# Patient Record
Sex: Female | Born: 1990 | Race: Black or African American | Hispanic: No | Marital: Single | Smoking: Never smoker
Health system: Southern US, Community
[De-identification: ages and names within clinical notes are randomized; demographics above are authoritative.]

## PROBLEM LIST (undated history)

## (undated) ENCOUNTER — Inpatient Hospital Stay (HOSPITAL_COMMUNITY): Payer: Self-pay

## (undated) DIAGNOSIS — I10 Essential (primary) hypertension: Secondary | ICD-10-CM

## (undated) DIAGNOSIS — A749 Chlamydial infection, unspecified: Secondary | ICD-10-CM

## (undated) HISTORY — PX: BREAST SURGERY: SHX581

## (undated) HISTORY — PX: NO PAST SURGERIES: SHX2092

---

## 2013-11-01 ENCOUNTER — Ambulatory Visit
Admission: RE | Admit: 2013-11-01 | Discharge: 2013-11-01 | Disposition: A | Discharge status: Home / Self Care | Payer: No Typology Code available for payment source | Source: Ambulatory Visit | Attending: Infectious Disease | Admitting: Infectious Disease

## 2013-11-01 ENCOUNTER — Other Ambulatory Visit: Payer: Self-pay | Admitting: Infectious Disease

## 2013-11-01 DIAGNOSIS — R7611 Nonspecific reaction to tuberculin skin test without active tuberculosis: Secondary | ICD-10-CM

## 2014-06-20 ENCOUNTER — Encounter (HOSPITAL_BASED_OUTPATIENT_CLINIC_OR_DEPARTMENT_OTHER): Payer: Self-pay

## 2014-06-20 ENCOUNTER — Emergency Department (HOSPITAL_BASED_OUTPATIENT_CLINIC_OR_DEPARTMENT_OTHER)
Admission: EM | Admit: 2014-06-20 | Discharge: 2014-06-20 | Disposition: A | Discharge status: Home / Self Care | Payer: BC Managed Care – PPO | Attending: Emergency Medicine | Admitting: Emergency Medicine

## 2014-06-20 DIAGNOSIS — Y92410 Unspecified street and highway as the place of occurrence of the external cause: Secondary | ICD-10-CM | POA: Diagnosis not present

## 2014-06-20 DIAGNOSIS — S39012A Strain of muscle, fascia and tendon of lower back, initial encounter: Secondary | ICD-10-CM

## 2014-06-20 DIAGNOSIS — Y998 Other external cause status: Secondary | ICD-10-CM | POA: Diagnosis not present

## 2014-06-20 DIAGNOSIS — S3992XA Unspecified injury of lower back, initial encounter: Secondary | ICD-10-CM | POA: Diagnosis present

## 2014-06-20 DIAGNOSIS — Y9389 Activity, other specified: Secondary | ICD-10-CM | POA: Diagnosis not present

## 2014-06-20 MED ORDER — IBUPROFEN 800 MG PO TABS: 800.0000 mg | ORAL_TABLET | Freq: Three times a day (TID) | ORAL | Status: DC

## 2014-06-20 MED ORDER — CYCLOBENZAPRINE HCL 10 MG PO TABS: 10.0000 mg | ORAL_TABLET | Freq: Three times a day (TID) | ORAL | Status: DC | PRN
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
## 2014-06-26 ENCOUNTER — Encounter (HOSPITAL_BASED_OUTPATIENT_CLINIC_OR_DEPARTMENT_OTHER): Payer: Self-pay

## 2014-06-26 ENCOUNTER — Emergency Department (HOSPITAL_BASED_OUTPATIENT_CLINIC_OR_DEPARTMENT_OTHER)
Admission: EM | Admit: 2014-06-26 | Discharge: 2014-06-26 | Disposition: A | Discharge status: Home / Self Care | Payer: BC Managed Care – PPO | Attending: Emergency Medicine | Admitting: Emergency Medicine

## 2014-06-26 DIAGNOSIS — M545 Low back pain, unspecified: Secondary | ICD-10-CM

## 2014-06-26 DIAGNOSIS — S3992XD Unspecified injury of lower back, subsequent encounter: Secondary | ICD-10-CM | POA: Insufficient documentation

## 2014-06-26 MED ORDER — TRAMADOL HCL 50 MG PO TABS: 50.0000 mg | ORAL_TABLET | Freq: Four times a day (QID) | ORAL | Status: DC | PRN
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
## 2014-06-26 NOTE — Discharge Instructions (Signed)
Back Pain, Adult °Low back pain is very common. About 1 in 5 people have back pain. The cause of low back pain is rarely dangerous. The pain often gets better over time. About half of people with a sudden onset of back pain feel better in just 2 weeks. About 8 in 10 people feel better by 6 weeks.  °CAUSES °Some common causes of back pain include: °· Strain of the muscles or ligaments supporting the spine. °· Wear and tear (degeneration) of the spinal discs. °· Arthritis. °· Direct injury to the back. °DIAGNOSIS °Most of the time, the direct cause of low back pain is not known. However, back pain can be treated effectively even when the exact cause of the pain is unknown. Answering your caregiver's questions about your overall health and symptoms is one of the most accurate ways to make sure the cause of your pain is not dangerous. If your caregiver needs more information, he or she may order lab work or imaging tests (X-rays or MRIs). However, even if imaging tests show changes in your back, this usually does not require surgery. °HOME CARE INSTRUCTIONS °For many people, back pain returns. Since low back pain is rarely dangerous, it is often a condition that people can learn to manage on their own.  °· Remain active. It is stressful on the back to sit or stand in one place. Do not sit, drive, or stand in one place for more than 30 minutes at a time. Take short walks on level surfaces as soon as pain allows. Try to increase the length of time you walk each day. °· Do not stay in bed. Resting more than 1 or 2 days can delay your recovery. °· Do not avoid exercise or work. Your body is made to move. It is not dangerous to be active, even though your back may hurt. Your back will likely heal faster if you return to being active before your pain is gone. °· Pay attention to your body when you  bend and lift. Many people have less discomfort when lifting if they bend their knees, keep the load close to their bodies, and  avoid twisting. Often, the most comfortable positions are those that put less stress on your recovering back. °· Find a comfortable position to sleep. Use a firm mattress and lie on your side with your knees slightly bent. If you lie on your back, put a pillow under your knees. °· Only take over-the-counter or prescription medicines as directed by your caregiver. Over-the-counter medicines to reduce pain and inflammation are often the most helpful. Your caregiver may prescribe muscle relaxant drugs. These medicines help dull your pain so you can more quickly return to your normal activities and healthy exercise. °· Put ice on the injured area. °¨ Put ice in a plastic bag. °¨ Place a towel between your skin and the bag. °¨ Leave the ice on for 15-20 minutes, 03-04 times a day for the first 2 to 3 days. After that, ice and heat may be alternated to reduce pain and spasms. °· Ask your caregiver about trying back exercises and gentle massage. This may be of some benefit. °· Avoid feeling anxious or stressed. Stress increases muscle tension and can worsen back pain. It is important to recognize when you are anxious or stressed and learn ways to manage it. Exercise is a great option. °SEEK MEDICAL CARE IF: °· You have pain that is not relieved with rest or medicine. °· You have pain that does not improve in 1 week. °· You have new symptoms. °· You are generally not feeling well. °SEEK   IMMEDIATE MEDICAL CARE IF:  °· You have pain that radiates from your back into your legs. °· You develop new bowel or bladder control problems. °· You have unusual weakness or numbness in your arms or legs. °· You develop nausea or vomiting. °· You develop abdominal pain. °· You feel faint. °Document Released: 06/16/2005 Document Revised: 12/16/2011 Document Reviewed: 10/18/2013 °ExitCare® Patient Information ©2015 [employer]. This information is not intended to replace advice given to you by your health care provider. Make sure you  discuss any questions you have with your health care provider. ° °Back Exercises °Back exercises help treat and prevent back injuries. The goal of back exercises is to increase the strength of your abdominal and back muscles and the flexibility of your back. These exercises should be started when you no longer have back pain. Back exercises include: °· Pelvic Tilt. Lie on your back with your knees bent. Tilt your pelvis until the lower part of your back is against the floor. Hold this position 5 to 10 sec and repeat 5 to 10 times. °· Knee to Chest. Pull first 1 knee up against your chest and hold for 20 to 30 seconds, repeat this with the other knee, and then both knees. This may be done with the other leg straight or bent, whichever feels better. °· Sit-Ups or Curl-Ups. Bend your knees 90 degrees. Start with tilting your pelvis, and do a partial, slow sit-up, lifting your trunk only 30 to 45 degrees off the floor. Take at least 2 to 3 seconds for each sit-up. Do not do sit-ups with your knees out straight. If partial sit-ups are difficult, simply do the above but with only tightening your abdominal muscles and holding it as directed. °· Hip-Lift. Lie on your back with your knees flexed 90 degrees. Push down with your feet and shoulders as you raise your hips a couple inches off the floor; hold for 10 seconds, repeat 5 to 10 times. °· Back arches. Lie on your stomach, propping yourself up on bent elbows. Slowly press on your hands, causing an arch in your low back. Repeat 3 to 5 times. Any initial stiffness and discomfort should lessen with repetition over time. °· Shoulder-Lifts. Lie face down with arms beside your body. Keep hips and torso pressed to floor as you slowly lift your head and shoulders off the floor. °Do not overdo your exercises, especially in the beginning. Exercises may cause you some mild back discomfort which lasts for a few minutes; however, if the pain is more severe, or lasts for more than 15  minutes, do not continue exercises until you see your caregiver. Improvement with exercise therapy for back problems is slow.  °See your caregivers for assistance with developing a proper back exercise program. °Document Released: 07/24/2004 Document Revised: 09/08/2011 Document Reviewed: 04/17/2011 °ExitCare® Patient Information ©2015 [employer]. This information is not intended to replace advice given to you by your health care provider. Make sure you discuss any questions you have with your health care provider. ° °
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
## 2015-11-21 LAB — OB RESULTS CONSOLE HEPATITIS B SURFACE ANTIGEN: HEP B S AG: NEGATIVE

## 2015-11-21 LAB — OB RESULTS CONSOLE ABO/RH: RH TYPE: POSITIVE

## 2015-11-21 LAB — OB RESULTS CONSOLE GC/CHLAMYDIA
Chlamydia: NEGATIVE
Gonorrhea: NEGATIVE

## 2015-11-21 LAB — OB RESULTS CONSOLE HIV ANTIBODY (ROUTINE TESTING): HIV: NONREACTIVE

## 2015-11-21 LAB — OB RESULTS CONSOLE GBS: GBS: NEGATIVE

## 2015-11-21 LAB — OB RESULTS CONSOLE ANTIBODY SCREEN: Antibody Screen: NEGATIVE

## 2015-11-21 LAB — OB RESULTS CONSOLE RPR: RPR: NONREACTIVE

## 2015-11-21 LAB — OB RESULTS CONSOLE RUBELLA ANTIBODY, IGM: RUBELLA: IMMUNE

## 2015-12-13 ENCOUNTER — Other Ambulatory Visit: Payer: Self-pay | Admitting: Obstetrics and Gynecology

## 2015-12-13 DIAGNOSIS — N6310 Unspecified lump in the right breast, unspecified quadrant: Principal | ICD-10-CM

## 2015-12-13 DIAGNOSIS — N6315 Unspecified lump in the right breast, overlapping quadrants: Secondary | ICD-10-CM

## 2015-12-18 ENCOUNTER — Ambulatory Visit
Admission: RE | Admit: 2015-12-18 | Discharge: 2015-12-18 | Disposition: A | Discharge status: Home / Self Care | Payer: BLUE CROSS/BLUE SHIELD | Source: Ambulatory Visit | Attending: Obstetrics and Gynecology | Admitting: Obstetrics and Gynecology

## 2015-12-18 DIAGNOSIS — N6310 Unspecified lump in the right breast, unspecified quadrant: Principal | ICD-10-CM

## 2015-12-18 DIAGNOSIS — N6315 Unspecified lump in the right breast, overlapping quadrants: Secondary | ICD-10-CM

## 2016-04-18 ENCOUNTER — Inpatient Hospital Stay (HOSPITAL_COMMUNITY)
Admission: AD | Admit: 2016-04-18 | Discharge: 2016-04-18 | Disposition: A | Discharge status: Home / Self Care | Payer: BLUE CROSS/BLUE SHIELD | Source: Ambulatory Visit | Attending: Obstetrics and Gynecology | Admitting: Obstetrics and Gynecology

## 2016-04-18 ENCOUNTER — Encounter (HOSPITAL_COMMUNITY): Payer: Self-pay | Admitting: *Deleted

## 2016-04-18 DIAGNOSIS — O9A213 Injury, poisoning and certain other consequences of external causes complicating pregnancy, third trimester: Secondary | ICD-10-CM | POA: Diagnosis not present

## 2016-04-18 DIAGNOSIS — T1490XA Injury, unspecified, initial encounter: Secondary | ICD-10-CM

## 2016-04-18 DIAGNOSIS — Z3A33 33 weeks gestation of pregnancy: Secondary | ICD-10-CM | POA: Diagnosis not present

## 2016-04-18 DIAGNOSIS — Z3689 Encounter for other specified antenatal screening: Secondary | ICD-10-CM

## 2016-04-18 DIAGNOSIS — Z3493 Encounter for supervision of normal pregnancy, unspecified, third trimester: Secondary | ICD-10-CM

## 2016-04-18 DIAGNOSIS — O26893 Other specified pregnancy related conditions, third trimester: Secondary | ICD-10-CM | POA: Insufficient documentation

## 2016-04-18 HISTORY — DX: Chlamydial infection, unspecified: A74.9

## 2016-04-18 MED ORDER — ACETAMINOPHEN 500 MG PO TABS
1000.0000 mg | ORAL_TABLET | Freq: Four times a day (QID) | ORAL | Status: DC | PRN
  Administered 2016-04-18: 1000 mg via ORAL
  Filled 2016-04-18: qty 2

## 2016-04-18 NOTE — MAU Note (Signed)
Urine in lab 
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
## 2016-04-18 NOTE — Discharge Instructions (Signed)
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
## 2016-04-27 ENCOUNTER — Inpatient Hospital Stay (HOSPITAL_COMMUNITY)
Admission: AD | Admit: 2016-04-27 | Discharge: 2016-04-27 | Disposition: A | Discharge status: Home / Self Care | Payer: BLUE CROSS/BLUE SHIELD | Source: Ambulatory Visit | Attending: Obstetrics and Gynecology | Admitting: Obstetrics and Gynecology

## 2016-04-27 ENCOUNTER — Encounter (HOSPITAL_COMMUNITY): Payer: Self-pay | Admitting: Certified Nurse Midwife

## 2016-04-27 DIAGNOSIS — O26893 Other specified pregnancy related conditions, third trimester: Secondary | ICD-10-CM | POA: Diagnosis not present

## 2016-04-27 DIAGNOSIS — O169 Unspecified maternal hypertension, unspecified trimester: Secondary | ICD-10-CM

## 2016-04-27 DIAGNOSIS — O26899 Other specified pregnancy related conditions, unspecified trimester: Secondary | ICD-10-CM

## 2016-04-27 DIAGNOSIS — N898 Other specified noninflammatory disorders of vagina: Secondary | ICD-10-CM | POA: Diagnosis present

## 2016-04-27 DIAGNOSIS — Z3A35 35 weeks gestation of pregnancy: Secondary | ICD-10-CM | POA: Insufficient documentation

## 2016-04-27 DIAGNOSIS — R030 Elevated blood-pressure reading, without diagnosis of hypertension: Secondary | ICD-10-CM | POA: Diagnosis not present

## 2016-04-27 LAB — COMPREHENSIVE METABOLIC PANEL
ALT: 14 U/L (ref 14–54)
ANION GAP: 6 (ref 5–15)
AST: 17 U/L (ref 15–41)
Albumin: 3 g/dL — ABNORMAL LOW (ref 3.5–5.0)
Alkaline Phosphatase: 57 U/L (ref 38–126)
BUN: 8 mg/dL (ref 6–20)
CHLORIDE: 105 mmol/L (ref 101–111)
CO2: 25 mmol/L (ref 22–32)
Calcium: 9 mg/dL (ref 8.9–10.3)
Creatinine, Ser: 0.73 mg/dL (ref 0.44–1.00)
Glucose, Bld: 83 mg/dL (ref 65–99)
POTASSIUM: 4.3 mmol/L (ref 3.5–5.1)
Sodium: 136 mmol/L (ref 135–145)
Total Bilirubin: 0.4 mg/dL (ref 0.3–1.2)
Total Protein: 6.7 g/dL (ref 6.5–8.1)

## 2016-04-27 LAB — PROTEIN / CREATININE RATIO, URINE
CREATININE, URINE: 82 mg/dL
Protein Creatinine Ratio: 0.09 mg/mg{Cre} (ref 0.00–0.15)
TOTAL PROTEIN, URINE: 7 mg/dL

## 2016-04-27 LAB — CBC
HCT: 32.8 % — ABNORMAL LOW (ref 36.0–46.0)
Hemoglobin: 10.8 g/dL — ABNORMAL LOW (ref 12.0–15.0)
MCH: 24.3 pg — AB (ref 26.0–34.0)
MCHC: 32.9 g/dL (ref 30.0–36.0)
MCV: 73.7 fL — AB (ref 78.0–100.0)
PLATELETS: 301 10*3/uL (ref 150–400)
RBC: 4.45 MIL/uL (ref 3.87–5.11)
RDW: 18.4 % — AB (ref 11.5–15.5)
WBC: 9.5 10*3/uL (ref 4.0–10.5)

## 2016-04-27 NOTE — Discharge Instructions (Signed)

## 2016-04-27 NOTE — MAU Note (Signed)
Pt states she is having ctxs every 4 minutes for the last 40 minutes. Pt states she has been leaking all day today. Pt denies vaginal bleeding. Fetus is active.

## 2016-04-27 NOTE — MAU Provider Note (Signed)
Chief Complaint:  Rupture of Membranes and Labor Eval   First Provider Initiated Contact with Patient 04/27/16 2045     HPI: Michele Pennington is a 25 y.o. G2P0010 at 3135w0dwho presents to maternity admissions reporting leaking of fluid making her underwear wet. Did not require wearing pads.  States has some tightening up of abdomen.  Not very painful, just "crampy". She reports good fetal movement, denies vaginal bleeding, vaginal itching/burning, urinary symptoms, h/a, dizziness, n/v, diarrhea, constipation or fever/chills.  She denies headache, visual changes or RUQ abdominal pain. But did have a headache this morning.  Vaginal Discharge  The patient's primary symptoms include vaginal discharge. The patient's pertinent negatives include no genital itching, genital lesions, genital odor or vaginal bleeding. This is a new problem. The current episode started today. The problem occurs intermittently. The problem has been unchanged. The pain is mild. She is pregnant. Associated symptoms include abdominal pain. Pertinent negatives include no back pain, chills, constipation, diarrhea, fever, hematuria, nausea or vomiting. The vaginal discharge was white and milky. There has been no bleeding. She has not been passing clots. She has not been passing tissue. Nothing aggravates the symptoms. She has tried nothing for the symptoms.   RN Note: Pt states she is having ctxs every 4 minutes for the last 40 minutes. Pt states she has been leaking all day today. Pt denies vaginal bleeding. Fetus is active.  Past Medical History: Past Medical History:  Diagnosis Date  . Chlamydia   . Medical history non-contributory     Past obstetric history: OB History  Gravida Para Term Preterm AB Living  2       1 0  SAB TAB Ectopic Multiple Live Births    1     0    # Outcome Date GA Lbr Len/2nd Weight Sex Delivery Anes PTL Lv  2 Current           1 TAB               Past Surgical History: Past Surgical  History:  Procedure Laterality Date  . NO PAST SURGERIES      Family History: Family History  Problem Relation Age of Onset  . Diabetes Mother   . Hypertension Mother   . Cancer Mother   . Diabetes Maternal Aunt   . Diabetes Maternal Grandmother     Social History: Social History  Substance Use Topics  . Smoking status: Never Smoker  . Smokeless tobacco: Never Used  . Alcohol use No    Allergies: No Known Allergies  Meds:  Prescriptions Prior to Admission  Medication Sig Dispense Refill Last Dose  . acetaminophen (TYLENOL) 500 MG tablet Take 1,000 mg by mouth every 8 (eight) hours as needed for mild pain or headache.   04/17/2016 at Unknown time  . cyclobenzaprine (FLEXERIL) 10 MG tablet Take 1 tablet (10 mg total) by mouth every 8 (eight) hours as needed for muscle spasms. 10 tablet 0   . Fe Fum-FePoly-Vit C-Vit B3 (INTEGRA) 62.5-62.5-40-3 MG CAPS Take 1 capsule by mouth daily.   04/17/2016 at Unknown time  . Prenatal Vit-Fe Fumarate-FA (PRENATAL MULTIVITAMIN) TABS tablet Take 1 tablet by mouth daily at 12 noon.   04/18/2016 at Unknown time    I have reviewed patient's Past Medical Hx, Surgical Hx, Family Hx, Social Hx, medications and allergies.   ROS:  Review of Systems  Constitutional: Negative for chills and fever.  Gastrointestinal: Positive for abdominal pain. Negative for constipation, diarrhea, nausea  and vomiting.  Genitourinary: Positive for vaginal discharge. Negative for hematuria.  Musculoskeletal: Negative for back pain.   Other systems negative  Physical Exam  Patient Vitals for the past 24 hrs:  BP Temp Temp src Pulse Resp  04/27/16 2043 141/81 97.6 F (36.4 C) Oral 102 18   Vitals:   04/27/16 2043 04/27/16 2050 04/27/16 2105  BP: 141/81 144/81 141/78  Pulse: 102 96 104  Resp: 18    Temp: 97.6 F (36.4 C)    TempSrc: Oral      Constitutional: Well-developed, well-nourished female in no acute distress.  Cardiovascular: normal rate and  rhythm Respiratory: normal effort, clear to auscultation bilaterally GI: Abd soft, non-tender, gravid appropriate for gestational age.   No rebound or guarding. MS: Extremities nontender, no edema, normal ROM Neurologic: Alert and oriented x 4.   DTRs 2+ with no clonus GU: Neg CVAT.  PELVIC EXAM: Cervix pink, visually closed, without lesion, mod to large milky white discharge, vaginal walls and external genitalia normal No pooling, no ferning  Dilation: Closed Effacement (%): Thick Station: -3 Exam by:: Artelia LarocheM. Hollie Bartus CNM  FHT:  Baseline 140 , moderate variability, accelerations present, no decelerations  Several tiny dips in HR, lasting only 10 seconds each, will watch Contractions:  Irregular, appears to have little discomfort when she has them.  About 3 UCs in 30-40 minutes   Labs:   Imaging:  No results found.  MAU Course/MDM: I have ordered labs and reviewed results.  PIH labs ordered due to elevated systolic pressures NST reviewed Consult Dr [name] with presentation, exam findings and test results.    Assessment: SIUP at 565w0d Leukorrhea No evidence of ruptured membranes Systolic blood pressure elevation, per Dr [name], patient may have chronic hypertension  Plan: PIH evaluation.   Wynelle BourgeoisMarie Edison Nicholson CNM, MSN Certified Nurse-Midwife 04/27/2016 8:55 PM

## 2016-05-20 ENCOUNTER — Inpatient Hospital Stay (HOSPITAL_COMMUNITY)
Admission: AD | Admit: 2016-05-20 | Discharge: 2016-05-20 | Disposition: A | Discharge status: Home / Self Care | Payer: BLUE CROSS/BLUE SHIELD | Source: Ambulatory Visit | Attending: Obstetrics & Gynecology | Admitting: Obstetrics & Gynecology

## 2016-05-20 ENCOUNTER — Encounter (HOSPITAL_COMMUNITY): Payer: Self-pay | Admitting: *Deleted

## 2016-05-20 ENCOUNTER — Telehealth (HOSPITAL_COMMUNITY): Payer: Self-pay | Admitting: *Deleted

## 2016-05-20 DIAGNOSIS — O133 Gestational [pregnancy-induced] hypertension without significant proteinuria, third trimester: Secondary | ICD-10-CM | POA: Insufficient documentation

## 2016-05-20 DIAGNOSIS — Z3A38 38 weeks gestation of pregnancy: Secondary | ICD-10-CM | POA: Diagnosis not present

## 2016-05-20 DIAGNOSIS — R030 Elevated blood-pressure reading, without diagnosis of hypertension: Secondary | ICD-10-CM | POA: Diagnosis present

## 2016-05-20 HISTORY — DX: Essential (primary) hypertension: I10

## 2016-05-20 LAB — COMPREHENSIVE METABOLIC PANEL
ALK PHOS: 76 U/L (ref 38–126)
ALT: 12 U/L — AB (ref 14–54)
AST: 17 U/L (ref 15–41)
Albumin: 2.8 g/dL — ABNORMAL LOW (ref 3.5–5.0)
Anion gap: 8 (ref 5–15)
BUN: 8 mg/dL (ref 6–20)
CALCIUM: 8.9 mg/dL (ref 8.9–10.3)
CO2: 22 mmol/L (ref 22–32)
CREATININE: 0.77 mg/dL (ref 0.44–1.00)
Chloride: 105 mmol/L (ref 101–111)
GFR calc non Af Amer: >60 mL/min (ref >60)
Glucose, Bld: 100 mg/dL — ABNORMAL HIGH (ref 65–99)
Potassium: 4 mmol/L (ref 3.5–5.1)
SODIUM: 135 mmol/L (ref 135–145)
Total Bilirubin: 0.1 mg/dL — ABNORMAL LOW (ref 0.3–1.2)
Total Protein: 6.6 g/dL (ref 6.5–8.1)

## 2016-05-20 LAB — CBC
HCT: 32.7 % — ABNORMAL LOW (ref 36.0–46.0)
HEMOGLOBIN: 10.7 g/dL — AB (ref 12.0–15.0)
MCH: 23.9 pg — AB (ref 26.0–34.0)
MCHC: 32.7 g/dL (ref 30.0–36.0)
MCV: 73.2 fL — ABNORMAL LOW (ref 78.0–100.0)
Platelets: 371 10*3/uL (ref 150–400)
RBC: 4.47 MIL/uL (ref 3.87–5.11)
RDW: 19.1 % — ABNORMAL HIGH (ref 11.5–15.5)
WBC: 8.9 10*3/uL (ref 4.0–10.5)

## 2016-05-20 LAB — URINALYSIS, ROUTINE W REFLEX MICROSCOPIC
Bilirubin Urine: NEGATIVE
Glucose, UA: NEGATIVE mg/dL
Hgb urine dipstick: NEGATIVE
KETONES UR: NEGATIVE mg/dL
LEUKOCYTES UA: NEGATIVE
NITRITE: NEGATIVE
PH: 6 (ref 5.0–8.0)
Protein, ur: NEGATIVE mg/dL

## 2016-05-20 LAB — LACTATE DEHYDROGENASE: LDH: 151 U/L (ref 98–192)

## 2016-05-20 LAB — PROTEIN / CREATININE RATIO, URINE
Creatinine, Urine: 84 mg/dL
Protein Creatinine Ratio: 0.08 mg/mg{Cre} (ref 0.00–0.15)
TOTAL PROTEIN, URINE: 7 mg/dL

## 2016-05-20 LAB — URIC ACID: URIC ACID, SERUM: 6.7 mg/dL — AB (ref 2.3–6.6)

## 2016-05-20 NOTE — MAU Note (Addendum)
Elevated BP in office. Recent hx seeing spots, HA. No visual sxs today, very slight HA. Trace protein. Sent from office. Hx chronic HTN. No medications.

## 2016-05-20 NOTE — Telephone Encounter (Signed)
Preadmission screen  

## 2016-05-20 NOTE — MAU Provider Note (Signed)
History     CSN: [id number]  Arrival date and time: 05/20/16 0930   First Provider Initiated Contact with Patient 05/20/16 1009      Chief Complaint  Patient presents with  . Hypertension   HPI Michele Pennington is a 25 y.o. G2P0010 at 1132w2d who presents sent from office for BP evaluation. [facility]; not currently on meds. Was in office today for routine OB appt & found to have elevated BP. Reports dull frontal headache today that she rates 2/10. Has not treated. Has blurred vision yesterday; no vision changes today. Denies chest pain, epigastric pain, n/v, abdominal pain, VB, or LOF. Positive fetal movement. Saw Dr. Charlotta Newtonzan today but normally sees Dr. [name].   OB History    Gravida Para Term Preterm AB Living   2       1 0   SAB TAB Ectopic Multiple Live Births     1     0      Past Medical History:  Diagnosis Date  . Chlamydia   . Hypertension     Past Surgical History:  Procedure Laterality Date  . NO PAST SURGERIES      Family History  Problem Relation Age of Onset  . Diabetes Mother   . Hypertension Mother   . Cancer Mother   . Diabetes Maternal Aunt   . Diabetes Maternal Grandmother     Social History  Substance Use Topics  . Smoking status: Never Smoker  . Smokeless tobacco: Never Used  . Alcohol use No    Allergies: No Known Allergies  Prescriptions Prior to Admission  Medication Sig Dispense Refill Last Dose  . acetaminophen (TYLENOL) 500 MG tablet Take 1,000 mg by mouth every 8 (eight) hours as needed for mild pain or headache.   Past Week at Unknown time  . Fe Fum-FePoly-Vit C-Vit B3 (INTEGRA) 62.5-62.5-40-3 MG CAPS Take 1 capsule by mouth daily.   05/19/2016 at Unknown time  . Prenatal Vit-Fe Fumarate-FA (PRENATAL MULTIVITAMIN) TABS tablet Take 1 tablet by mouth daily at 12 noon.   05/20/2016 at Unknown time    Review of Systems  Constitutional: Negative.   Eyes: Negative for blurred vision.  Respiratory: Negative for  shortness of breath.   Cardiovascular: Negative for chest pain.  Gastrointestinal: Negative.   Genitourinary: Negative.   Neurological: Positive for headaches. Negative for dizziness.   Physical Exam   Blood pressure 156/83, pulse 75, temperature 99.1 F (37.3 C), temperature source Oral, resp. rate 18, height 5\' 3"  (1.6 m), weight 235 lb (106.6 kg).  Temp:  [99.1 F (37.3 C)] 99.1 F (37.3 C) (11/21 0947) Pulse Rate:  [61-89] 61 (11/21 1146) Resp:  [16-18] 16 (11/21 1146) BP: (136-156)/(81-95) 150/90 (11/21 1146) Weight:  [235 lb (106.6 kg)] 235 lb (106.6 kg) (11/21 [id number])   Physical Exam  Nursing note and vitals reviewed. Constitutional: She is oriented to person, place, and time. She appears well-developed and well-nourished. No distress.  HENT:  Head: Normocephalic and atraumatic.  Eyes: Conjunctivae are normal. Right eye exhibits no discharge. Left eye exhibits no discharge. No scleral icterus.  Neck: Normal range of motion.  Cardiovascular: Normal rate, regular rhythm and normal heart sounds.   No murmur heard. Respiratory: Effort normal and breath sounds normal. No respiratory distress. She has no wheezes.  GI: Soft. There is no tenderness.  Musculoskeletal: She exhibits edema (2+ pitting edema BLE).  Neurological: She is alert and oriented to person, place, and time. She has normal  reflexes.  No clonus  Skin: Skin is warm and dry. She is not diaphoretic.  Psychiatric: She has a normal mood and affect. Her behavior is normal. Judgment and thought content normal.   Fetal Tracing:  Baseline: 140 Variability: moderate Accelerations: 15x15 Decelerations: none  Toco: irregular ctx, 4-8 mins; pt denies  MAU Course  Procedures Results for orders placed or performed during the hospital encounter of 05/20/16 (from the past 24 hour(s))  Urinalysis, Routine w reflex microscopic ([facility])     Status: Abnormal   Collection Time: 05/20/16  9:57 AM  Result Value Ref  Range   Color, Urine YELLOW YELLOW   APPearance CLEAR CLEAR   Specific Gravity, Urine <1.005 (L) 1.005 - 1.030   pH 6.0 5.0 - 8.0   Glucose, UA NEGATIVE NEGATIVE mg/dL   Hgb urine dipstick NEGATIVE NEGATIVE   Bilirubin Urine NEGATIVE NEGATIVE   Ketones, ur NEGATIVE NEGATIVE mg/dL   Protein, ur NEGATIVE NEGATIVE mg/dL   Nitrite NEGATIVE NEGATIVE   Leukocytes, UA NEGATIVE NEGATIVE  Protein / creatinine ratio, urine     Status: None   Collection Time: 05/20/16 10:02 AM  Result Value Ref Range   Creatinine, Urine 84.00 mg/dL   Total Protein, Urine 7 mg/dL   Protein Creatinine Ratio 0.08 0.00 - 0.15 mg/mg[Cre]  CBC     Status: Abnormal   Collection Time: 05/20/16 10:21 AM  Result Value Ref Range   WBC 8.9 4.0 - 10.5 K/uL   RBC 4.47 3.87 - 5.11 MIL/uL   Hemoglobin 10.7 (L) 12.0 - 15.0 g/dL   HCT 78.232.7 (L) 95.636.0 - 21.346.0 %   MCV 73.2 (L) 78.0 - 100.0 fL   MCH 23.9 (L) 26.0 - 34.0 pg   MCHC 32.7 30.0 - 36.0 g/dL   RDW 08.619.1 (H) 57.811.5 - 46.915.5 %   Platelets 371 150 - 400 K/uL  Comprehensive metabolic panel     Status: Abnormal   Collection Time: 05/20/16 10:21 AM  Result Value Ref Range   Sodium 135 135 - 145 mmol/L   Potassium 4.0 3.5 - 5.1 mmol/L   Chloride 105 101 - 111 mmol/L   CO2 22 22 - 32 mmol/L   Glucose, Bld 100 (H) 65 - 99 mg/dL   BUN 8 6 - 20 mg/dL   Creatinine, Ser 6.290.77 0.44 - 1.00 mg/dL   Calcium 8.9 8.9 - 52.810.3 mg/dL   Total Protein 6.6 6.5 - 8.1 g/dL   Albumin 2.8 (L) 3.5 - 5.0 g/dL   AST 17 15 - 41 U/L   ALT 12 (L) 14 - 54 U/L   Alkaline Phosphatase 76 38 - 126 U/L   Total Bilirubin 0.1 (L) 0.3 - 1.2 mg/dL   GFR calc non Af Amer >60 >60 mL/min   GFR calc Af Amer >60 >60 mL/min   Anion gap 8 5 - 15  Lactate dehydrogenase     Status: None   Collection Time: 05/20/16 10:21 AM  Result Value Ref Range   LDH 151 98 - 192 U/L  Uric acid     Status: Abnormal   Collection Time: 05/20/16 10:21 AM  Result Value Ref Range   Uric Acid, Serum 6.7 (H) 2.3 - 6.6 mg/dL     MDM Reactive fetal tracing Elevated BPs; none severe range CBC, CMP, uric acid, LDR, urine PCR S/w Dr. Charlotta Newtonzan regarding BPs & labs; ok to discharge home with strict return precautions. Pt to return to MAU Friday for BP check & BPP. Will  plan for IOL on Monday when pt is 39 wks unless symptoms worsen  Assessment and Plan  A:  1. Pregnancy-induced hypertension in third trimester    P: Discharge home Return to MAU Friday for BPP & BP check (future order placed for BPP) Discussed reasons to return to MAU including s/s of worsening BP/preeclampsia Dr. Charlotta Newton will call pt regarding IOL schedule  Michele Pennington 05/20/2016, 10:09 AM

## 2016-05-20 NOTE — Discharge Instructions (Signed)
Hypertension During Pregnancy °Hypertension, commonly called high blood pressure, is when the force of blood pumping through your arteries is too strong. Arteries are blood vessels that carry blood from the heart throughout the body. Hypertension during pregnancy can cause problems for you and your baby. Your baby may be born early (prematurely) or may not weigh as much as he or she should at birth. Very bad cases of hypertension during pregnancy can be life-threatening. °Different types of hypertension can occur during pregnancy. These include: °· Chronic hypertension. This happens when: °¨ You have hypertension before pregnancy and it continues during pregnancy. °¨ You develop hypertension before you are [redacted] weeks pregnant, and it continues during pregnancy. °· Gestational hypertension. This is hypertension that develops after the 20th week of pregnancy. °· Preeclampsia, also called toxemia of pregnancy. This is a very serious type of hypertension that develops only during pregnancy. It affects the whole body, and it can be very dangerous for you and your baby. °Gestational hypertension and preeclampsia usually go away within 6 weeks after your baby is born. Women who have hypertension during pregnancy have a greater chance of developing hypertension later in life or during future pregnancies. °What are the causes? °The exact cause of hypertension is not known. °What increases the risk? °There are certain factors that make it more likely for you to develop hypertension during pregnancy. These include: °· Having hypertension during a previous pregnancy or prior to pregnancy. °· Being overweight. °· Being older than age 40. °· Being pregnant for the first time or being pregnant with more than one baby. °· Becoming pregnant using fertilization methods such as IVF (in vitro fertilization). °· Having diabetes, kidney problems, or systemic lupus erythematosus. °· Having a family history of hypertension. °What are the  signs or symptoms? °Chronic hypertension and gestational hypertension rarely cause symptoms. Preeclampsia causes symptoms, which may include: °· Increased protein in your urine. Your health care provider will check for this at every visit before you give birth (prenatal visit). °· Severe headaches. °· Sudden weight gain. °· Swelling of the hands, face, legs, and feet. °· Nausea and vomiting. °· Vision problems, such as blurred or double vision. °· Numbness in the face, arms, legs, and feet. °· Dizziness. °· Slurred speech. °· Sensitivity to bright lights. °· Abdominal pain. °· Convulsions. °How is this diagnosed? °You may be diagnosed with hypertension during a routine prenatal exam. At each prenatal visit, you may: °· Have a urine test to check for high amounts of protein in your urine. °· Have your blood pressure checked. A blood pressure reading is recorded as two numbers, such as "120 over 80" (or 120/80). The first ("top") number is called the systolic pressure. It is a measure of the pressure in your arteries when your heart beats. The second ("bottom") number is called the diastolic pressure. It is a measure of the pressure in your arteries as your heart relaxes between beats. Blood pressure is measured in a unit called mm Hg. A normal blood pressure reading is: °¨ Systolic: below 120. °¨ Diastolic: below 80. °The type of hypertension that you are diagnosed with depends on your test results and when your symptoms developed. °· Chronic hypertension is usually diagnosed before 20 weeks of pregnancy. °· Gestational hypertension is usually diagnosed after 20 weeks of pregnancy. °· Hypertension with high amounts of protein in the urine is diagnosed as preeclampsia. °· Blood pressure measurements that stay above 160 systolic, or above 110 diastolic, are signs of severe preeclampsia. °  How is this treated? °Treatment for hypertension during pregnancy varies depending on the type of hypertension you have and how  serious it is. °· If you take medicines called ACE inhibitors to treat chronic hypertension, you may need to switch medicines. ACE inhibitors should not be taken during pregnancy. °· If you have gestational hypertension, you may need to take blood pressure medicine. °· If you are at risk for preeclampsia, your health care provider may recommend that you take a low-dose aspirin every day to prevent high blood pressure during your pregnancy. °· If you have severe preeclampsia, you may need to be hospitalized so you and your baby can be monitored closely. You may also need to take medicine (magnesium sulfate) to prevent seizures and to lower blood pressure. This medicine may be given as an injection or through an IV tube. °· In some cases, if your condition gets worse, you may need to deliver your baby early. °Follow these instructions at home: °Eating and drinking °· Drink enough fluid to keep your urine clear or pale yellow. °· Eat a healthy diet that is low in salt (sodium). Do not add salt to your food. Check food labels to see how much sodium a food or beverage contains. °Lifestyle °· Do not use any products that contain nicotine or tobacco, such as cigarettes and e-cigarettes. If you need help quitting, ask your health care provider. °· Do not use alcohol. °· Avoid caffeine. °· Avoid stress as much as possible. Rest and get plenty of sleep. °General instructions °· Take over-the-counter and prescription medicines only as told by your health care provider. °· While lying down, lie on your left side. This keeps pressure off your baby. °· While sitting or lying down, raise (elevate) your feet. Try putting some pillows under your lower legs. °· Exercise regularly. Ask your health care provider what kinds of exercise are best for you. °· Keep all prenatal and follow-up visits as told by your health care provider. This is important. °Contact a health care provider if: °· You have symptoms that your health care provider  told you may require more treatment or monitoring, such as: °¨ Fever. °¨ Vomiting. °¨ Headache. °Get help right away if: °· You have severe abdominal pain or vomiting that does not get better with treatment. °· You suddenly develop swelling in your hands, ankles, or face. °· You gain 4 lbs (1.8 kg) or more in 1 week. °· You develop vaginal bleeding, or you have blood in your urine. °· You do not feel your baby moving as much as usual. °· You have blurred or double vision. °· You have muscle twitching or sudden tightening (spasms). °· You have shortness of breath. °· Your lips or fingernails turn blue. °This information is not intended to replace advice given to you by your health care provider. Make sure you discuss any questions you have with your health care provider. °Document Released: 03/04/2011 Document Revised: 01/04/2016 Document Reviewed: 11/30/2015 °[employer] Interactive Patient Education © 2017 [employer] ° °

## 2016-05-23 ENCOUNTER — Encounter (HOSPITAL_COMMUNITY): Payer: Self-pay

## 2016-05-23 ENCOUNTER — Inpatient Hospital Stay (EMERGENCY_DEPARTMENT_HOSPITAL)
Admission: AD | Admit: 2016-05-23 | Discharge: 2016-05-23 | Disposition: A | Discharge status: Home / Self Care | Payer: BLUE CROSS/BLUE SHIELD | Source: Ambulatory Visit | Attending: Obstetrics and Gynecology | Admitting: Obstetrics and Gynecology

## 2016-05-23 ENCOUNTER — Inpatient Hospital Stay (HOSPITAL_COMMUNITY): Payer: BLUE CROSS/BLUE SHIELD

## 2016-05-23 DIAGNOSIS — Z3A38 38 weeks gestation of pregnancy: Secondary | ICD-10-CM

## 2016-05-23 DIAGNOSIS — Z833 Family history of diabetes mellitus: Secondary | ICD-10-CM

## 2016-05-23 DIAGNOSIS — Z8249 Family history of ischemic heart disease and other diseases of the circulatory system: Secondary | ICD-10-CM

## 2016-05-23 DIAGNOSIS — Z3A39 39 weeks gestation of pregnancy: Secondary | ICD-10-CM | POA: Insufficient documentation

## 2016-05-23 DIAGNOSIS — O139 Gestational [pregnancy-induced] hypertension without significant proteinuria, unspecified trimester: Secondary | ICD-10-CM

## 2016-05-23 DIAGNOSIS — Z79899 Other long term (current) drug therapy: Secondary | ICD-10-CM | POA: Insufficient documentation

## 2016-05-23 DIAGNOSIS — O10919 Unspecified pre-existing hypertension complicating pregnancy, unspecified trimester: Secondary | ICD-10-CM

## 2016-05-23 DIAGNOSIS — O1002 Pre-existing essential hypertension complicating childbirth: Secondary | ICD-10-CM | POA: Diagnosis not present

## 2016-05-23 DIAGNOSIS — O99213 Obesity complicating pregnancy, third trimester: Secondary | ICD-10-CM | POA: Insufficient documentation

## 2016-05-23 DIAGNOSIS — Z809 Family history of malignant neoplasm, unspecified: Secondary | ICD-10-CM | POA: Insufficient documentation

## 2016-05-23 DIAGNOSIS — O10013 Pre-existing essential hypertension complicating pregnancy, third trimester: Secondary | ICD-10-CM

## 2016-05-23 NOTE — MAU Provider Note (Signed)
Chief Complaint:  Follow-up; Non-stress Test; and Hypertension   First Provider Initiated Contact with Patient 05/23/16 1351     HPI: Michele Pennington is a 25 y.o. G2P0010 at 2138w5dwho presents to maternity admissions reporting hypertension.  Was sent for BP check NST and BPP.  Marland Kitchen. She reports good fetal movement, denies LOF, vaginal bleeding, vaginal itching/burning, urinary symptoms, h/a, dizziness, n/v, diarrhea, constipation or fever/chills.  She denies headache, visual changes or RUQ abdominal pain.  Hypertension  This is a recurrent problem. The problem is unchanged. Pertinent negatives include no anxiety, blurred vision, chest pain, headaches, malaise/fatigue, palpitations, peripheral edema or shortness of breath. There are no associated agents to hypertension. Risk factors: Pregnancy third trimesterwe. Past treatments include nothing. There are no compliance problems.    RN Note: Pt presents to MAU for a follow up visit for a BP check, NST & BPP. Pt denies any h/a, visual disturbances or epigastric pain. Denies any bleeding or leaking of fluid. Reports good fetal movement.   Past Medical History: Past Medical History:  Diagnosis Date  . Chlamydia   . Hypertension     Past obstetric history: OB History  Gravida Para Term Preterm AB Living  2       1 0  SAB TAB Ectopic Multiple Live Births    1     0    # Outcome Date GA Lbr Len/2nd Weight Sex Delivery Anes PTL Lv  2 Current           1 TAB               Past Surgical History: Past Surgical History:  Procedure Laterality Date  . NO PAST SURGERIES      Family History: Family History  Problem Relation Age of Onset  . Diabetes Mother   . Hypertension Mother   . Cancer Mother   . Diabetes Maternal Aunt   . Diabetes Maternal Grandmother     Social History: Social History  Substance Use Topics  . Smoking status: Never Smoker  . Smokeless tobacco: Never Used  . Alcohol use No    Allergies: No Known  Allergies  Meds:  Prescriptions Prior to Admission  Medication Sig Dispense Refill Last Dose  . acetaminophen (TYLENOL) 500 MG tablet Take 1,000 mg by mouth every 8 (eight) hours as needed for mild pain or headache.   Past Week at Unknown time  . Fe Fum-FePoly-Vit C-Vit B3 (INTEGRA) 62.5-62.5-40-3 MG CAPS Take 1 capsule by mouth daily.   05/19/2016 at Unknown time  . Prenatal Vit-Fe Fumarate-FA (PRENATAL MULTIVITAMIN) TABS tablet Take 1 tablet by mouth daily at 12 noon.   05/20/2016 at Unknown time    I have reviewed patient's Past Medical Hx, Surgical Hx, Family Hx, Social Hx, medications and allergies.   ROS:  Review of Systems  Constitutional: Negative for malaise/fatigue.  Eyes: Negative for blurred vision.  Respiratory: Negative for shortness of breath.   Cardiovascular: Negative for chest pain and palpitations.  Neurological: Negative for headaches.   Other systems negative  Physical Exam  Patient Vitals for the past 24 hrs:  BP Temp Temp src Pulse Resp  05/23/16 1321 151/96 99.1 F (37.3 C) Oral 80 16  146/90 142/87 145/89 153/101  Constitutional: Well-developed, well-nourished female in no acute distress.  Cardiovascular: normal rate and rhythm Respiratory: normal effort, clear to auscultation bilaterally GI: Abd soft, non-tender, gravid appropriate for gestational age.   No rebound or guarding. MS: Extremities nontender, no edema, normal ROM  Neurologic: Alert and oriented x 4.   DTRs 2+ with no clonus GU: Neg CVAT.  PELVIC EXAM: Deferred  FHT:  Baseline 130 , moderate variability, accelerations present, no decelerations Contractions:  Irregular    Labs: No results found for this or any previous visit (from the past 24 hour(s)). A/Positive/-- (05/24 0000)  Imaging:  No results found. BPP 8/8  MAU Course/MDM: I have ordered labs and reviewed results.  NST reviewed and found reactive, Category I Consult Dr [name] with presentation, exam findings and test  results.  Treatments in MAU included BPP, NST.    Assessment: SIUP at 7875w1d Chronic hypertension Reassuring Category I NST BPP 8/8  Plan: Discharge home Labor precautions and fetal kick counts Follow up with induction this week Strict preeclampsia precautions  Pt stable at time of discharge.  Wynelle BourgeoisMarie Tekela Garguilo CNM, MSN Certified Nurse-Midwife 05/23/2016 1:51 PM

## 2016-05-23 NOTE — Discharge Instructions (Signed)
Hypertension During Pregnancy Hypertension is also called high blood pressure. High blood pressure means that the force of your blood moving in your body is too strong. When you are pregnant, this condition should be watched carefully. It can cause problems for you and your baby. Follow these instructions at home: Eating and drinking  Drink enough fluid to keep your pee (urine) clear or pale yellow.  Eat healthy foods that are low in salt (sodium). ? Do not add salt to your food. ? Check labels on foods and drinks to see much salt is in them. Look on the label where you see "Sodium." Lifestyle  Do not use any products that contain nicotine or tobacco, such as cigarettes and e-cigarettes. If you need help quitting, ask your doctor.  Do not use alcohol.  Avoid caffeine.  Avoid stress. Rest and get plenty of sleep. General instructions  Take over-the-counter and prescription medicines only as told by your doctor.  While lying down, lie on your left side. This keeps pressure off your baby.  While sitting or lying down, raise (elevate) your feet. Try putting some pillows under your lower legs.  Exercise regularly. Ask your doctor what kinds of exercise are best for you.  Keep all prenatal and follow-up visits as told by your doctor. This is important. Contact a doctor if:  You have symptoms that your doctor told you to watch for, such as: ? Fever. ? Throwing up (vomiting). ? Headache. Get help right away if:  You have very bad pain in your belly (abdomen).  You are throwing up, and this does not get better with treatment.  You suddenly get swelling in your hands, ankles, or face.  You gain 4 lb (1.8 kg) or more in 1 week.  You get bleeding from your vagina.  You have blood in your pee.  You do not feel your baby moving as much as normal.  You have a change in vision.  You have muscle twitching or sudden tightening (spasms).  You have trouble breathing.  Your lips  or fingernails turn blue. This information is not intended to replace advice given to you by your health care provider. Make sure you discuss any questions you have with your health care provider. Document Released: 07/19/2010 Document Revised: 02/26/2016 Document Reviewed: 02/26/2016 Elsevier Interactive Patient Education  2017 [employer]  

## 2016-05-23 NOTE — MAU Note (Signed)
Pt presents to MAU for a follow up visit for a BP check, NST & BPP. Pt denies any h/a, visual disturbances or epigastric pain. Denies any bleeding or leaking of fluid. Reports good fetal movement.

## 2016-05-25 MED ORDER — HYDRALAZINE HCL 20 MG/ML IJ SOLN
5.0000 mg | INTRAMUSCULAR | Status: AC | PRN
  Administered 2016-05-26 (×2): 5 mg via INTRAVENOUS
  Filled 2016-05-25 (×2): qty 1

## 2016-05-25 MED ORDER — LABETALOL HCL 5 MG/ML IV SOLN
20.0000 mg | INTRAVENOUS | Status: DC | PRN
  Filled 2016-05-25: qty 4

## 2016-05-25 NOTE — H&P (Signed)
Michele ButteCourtney M Pennington is a 25 y.o. female G2 P0010 @ 39 0/7 weeks presenting for induction of labor due to Chronic HTN. Pt has a h/o HTN prior to pregnancy per previous outpatient notes, pt denies h/o HTN.  BP was high normal in the first trimester and has slowly trended up. BP does not meet criteria for medication.  No signs of Preeclampsia.  NSTs have been reassuring. Pt transferred in the [facility] (Dr. [name]) mid second trimester.  Pregnancy also compromised by Trich at 35 weeks, TOC negative less than 1 week ago.  See other complications under prenatal complications.  OB History    Gravida Para Term Preterm AB Living   2       1 0   SAB TAB Ectopic Multiple Live Births     1     0     Past Medical History:  Diagnosis Date  . Chlamydia   . Hypertension    Past Surgical History:  Procedure Laterality Date  . NO PAST SURGERIES     Family History: family history includes Cancer in her mother; Diabetes in her maternal aunt, maternal grandmother, and mother; Hypertension in her mother. Social History:  reports that she has never smoked. She has never used smokeless tobacco. She reports that she does not drink alcohol or use drugs.     Maternal Diabetes: No Genetic Screening: Declined Maternal Ultrasounds/Referrals: Normal Fetal Ultrasounds or other Referrals:  None Maternal Substance Abuse:  No Significant Maternal Medications:  Meds include: Other: Flagyl Significant Maternal Lab Results:  Lab values include: Group B Strep negative Other Comments:  Ig M positive for Varicella but pt is s/p Varicella vaccination prior to pregnancy.  Asymptomatic.  Review of Systems  Gastrointestinal: Negative for abdominal pain.   Maternal Medical History:  Reason for admission: Induction of labor due to chronic HTN.   Fetal activity: Perceived fetal activity is normal.    Prenatal complications: HTN Trichimoniasis @ 35 weeks Fibroadenomas x 2 Varicella Ig M  positive-asymptomatic  Prenatal Complications - Diabetes: none.      There were no vitals taken for this visit. Maternal Exam:  Uterine Assessment: Contraction frequency is irregular.   Abdomen: Patient reports no abdominal tenderness. Estimated fetal weight is 05/05/16 @ 36 1/7 wks-EFW 48%, (5 lbs 14 oz +/- 8 oz).   Fetal presentation: vertex  Pelvis: adequate for delivery.   Cervix: Cervix evaluated by sterile speculum exam.   Long/high/closed per Dr. Charlotta Newtonzan on 11/21.  Fetal Exam Fetal Monitor Review: Mode: fetoscope.   Variability: moderate (6-25 bpm).   Pattern: accelerations present.   On 05/20/16.  Fetal State Assessment: Category I - tracings are normal.     Physical Exam  Constitutional: She is oriented to person, place, and time. She appears well-developed and well-nourished.  HENT:  Head: Normocephalic and atraumatic.  Eyes: EOM are normal.  Neck: Normal range of motion.  Respiratory: Effort normal. No respiratory distress.  GI: There is no tenderness.  Musculoskeletal: She exhibits edema.  Neurological: She is alert and oriented to person, place, and time. She has normal reflexes.  Skin: Skin is warm and dry.  Psychiatric: She has a normal mood and affect.    Prenatal labs: ABO, Rh: A/Positive/-- (05/24 0000) Antibody: Negative (05/24 0000) Rubella: Immune (05/24 0000) RPR: Nonreactive (05/24 0000)  HBsAg: Negative (05/24 0000)  HIV: Non-reactive (05/24 0000)  GBS: Negative (05/24 0000)   Assessment/Plan: IUP @ 39 0/7 weeks CHTN for IOL. Trichimoniasis adequately treated. Fibroadenomas  x 2. Varicella Ig M + asymptomatic.  Normal fetus.  Cytotec for cervical ripening. Hydralazine prn severe range BP. Baseline preeclampsia labs on admission. IV pain meds, Nitrous oxide prn.  Epidural at 3 cm. [facility] covering from 12 am to 7 am.     Geryl RankinsVARNADO, Nirali Magouirk 05/25/2016, 3:47 PM

## 2016-05-26 ENCOUNTER — Inpatient Hospital Stay (HOSPITAL_COMMUNITY)
Admission: AD | Admit: 2016-05-26 | Discharge: 2016-05-29 | DRG: 765 | Disposition: A | Discharge status: Home / Self Care | Payer: BLUE CROSS/BLUE SHIELD | Source: Ambulatory Visit | Attending: Obstetrics and Gynecology | Admitting: Obstetrics and Gynecology

## 2016-05-26 ENCOUNTER — Inpatient Hospital Stay (HOSPITAL_COMMUNITY): Payer: BLUE CROSS/BLUE SHIELD | Admitting: Anesthesiology

## 2016-05-26 ENCOUNTER — Encounter (HOSPITAL_COMMUNITY)
Admission: AD | Disposition: A | Discharge status: Home / Self Care | Payer: Self-pay | Source: Ambulatory Visit | Attending: Obstetrics and Gynecology

## 2016-05-26 ENCOUNTER — Encounter (HOSPITAL_COMMUNITY): Payer: Self-pay

## 2016-05-26 DIAGNOSIS — Z3490 Encounter for supervision of normal pregnancy, unspecified, unspecified trimester: Secondary | ICD-10-CM

## 2016-05-26 DIAGNOSIS — Z6841 Body Mass Index (BMI) 40.0 and over, adult: Secondary | ICD-10-CM

## 2016-05-26 DIAGNOSIS — Z3A39 39 weeks gestation of pregnancy: Secondary | ICD-10-CM | POA: Diagnosis not present

## 2016-05-26 DIAGNOSIS — O99214 Obesity complicating childbirth: Secondary | ICD-10-CM | POA: Diagnosis present

## 2016-05-26 DIAGNOSIS — O1002 Pre-existing essential hypertension complicating childbirth: Secondary | ICD-10-CM | POA: Diagnosis present

## 2016-05-26 LAB — COMPREHENSIVE METABOLIC PANEL
ALBUMIN: 3.3 g/dL — AB (ref 3.5–5.0)
ALT: 12 U/L — ABNORMAL LOW (ref 14–54)
AST: 17 U/L (ref 15–41)
Alkaline Phosphatase: 83 U/L (ref 38–126)
Anion gap: 10 (ref 5–15)
BUN: 12 mg/dL (ref 6–20)
CHLORIDE: 104 mmol/L (ref 101–111)
CO2: 21 mmol/L — ABNORMAL LOW (ref 22–32)
Calcium: 9 mg/dL (ref 8.9–10.3)
Creatinine, Ser: 0.86 mg/dL (ref 0.44–1.00)
GFR calc Af Amer: >60 mL/min (ref >60)
GFR calc non Af Amer: >60 mL/min (ref >60)
GLUCOSE: 107 mg/dL — AB (ref 65–99)
POTASSIUM: 3.8 mmol/L (ref 3.5–5.1)
Sodium: 135 mmol/L (ref 135–145)
Total Bilirubin: 0.5 mg/dL (ref 0.3–1.2)
Total Protein: 7.2 g/dL (ref 6.5–8.1)

## 2016-05-26 LAB — CBC
HCT: 33.8 % — ABNORMAL LOW (ref 36.0–46.0)
HEMATOCRIT: 34.3 % — AB (ref 36.0–46.0)
HEMOGLOBIN: 11.2 g/dL — AB (ref 12.0–15.0)
Hemoglobin: 11.4 g/dL — ABNORMAL LOW (ref 12.0–15.0)
MCH: 24.2 pg — ABNORMAL LOW (ref 26.0–34.0)
MCH: 24 pg — AB (ref 26.0–34.0)
MCHC: 33.7 g/dL (ref 30.0–36.0)
MCHC: 32.7 g/dL (ref 30.0–36.0)
MCV: 71.8 fL — ABNORMAL LOW (ref 78.0–100.0)
MCV: 73.4 fL — AB (ref 78.0–100.0)
PLATELETS: 337 10*3/uL (ref 150–400)
Platelets: 329 10*3/uL (ref 150–400)
RBC: 4.71 MIL/uL (ref 3.87–5.11)
RBC: 4.67 MIL/uL (ref 3.87–5.11)
RDW: 19.2 % — ABNORMAL HIGH (ref 11.5–15.5)
RDW: 19.1 % — ABNORMAL HIGH (ref 11.5–15.5)
WBC: 12.1 10*3/uL — AB (ref 4.0–10.5)
WBC: 12 10*3/uL — ABNORMAL HIGH (ref 4.0–10.5)

## 2016-05-26 LAB — PROTEIN / CREATININE RATIO, URINE
CREATININE, URINE: 187 mg/dL
PROTEIN CREATININE RATIO: 0.13 mg/mg{creat} (ref 0.00–0.15)
Total Protein, Urine: 24 mg/dL

## 2016-05-26 LAB — ABO/RH: ABO/RH(D): A POS

## 2016-05-26 LAB — RPR: RPR: NONREACTIVE

## 2016-05-26 LAB — TYPE AND SCREEN
ABO/RH(D): A POS
Antibody Screen: NEGATIVE

## 2016-05-26 SURGERY — Surgical Case
Anesthesia: Epidural

## 2016-05-26 MED ORDER — OXYCODONE-ACETAMINOPHEN 5-325 MG PO TABS: 1.0000 | ORAL_TABLET | ORAL | Status: DC | PRN

## 2016-05-26 MED ORDER — FENTANYL CITRATE (PF) 100 MCG/2ML IJ SOLN
100.0000 ug | INTRAMUSCULAR | Status: DC | PRN
  Administered 2016-05-26 (×2): 100 ug via INTRAVENOUS
  Filled 2016-05-26 (×2): qty 2

## 2016-05-26 MED ORDER — MORPHINE SULFATE (PF) 0.5 MG/ML IJ SOLN
INTRAMUSCULAR | Status: AC
  Filled 2016-05-26: qty 10

## 2016-05-26 MED ORDER — ACETAMINOPHEN 325 MG PO TABS
650.0000 mg | ORAL_TABLET | ORAL | Status: DC | PRN
  Administered 2016-05-26: 650 mg via ORAL
  Filled 2016-05-26: qty 2

## 2016-05-26 MED ORDER — ONDANSETRON HCL 4 MG/2ML IJ SOLN
INTRAMUSCULAR | Status: AC
  Filled 2016-05-26: qty 2

## 2016-05-26 MED ORDER — ONDANSETRON HCL 4 MG/2ML IJ SOLN
4.0000 mg | Freq: Four times a day (QID) | INTRAMUSCULAR | Status: DC | PRN
  Administered 2016-05-26: 4 mg via INTRAVENOUS
  Filled 2016-05-26: qty 2

## 2016-05-26 MED ORDER — ONDANSETRON HCL 4 MG/2ML IJ SOLN
INTRAMUSCULAR | Status: DC | PRN
  Administered 2016-05-26: 4 mg via INTRAVENOUS

## 2016-05-26 MED ORDER — OXYTOCIN 40 UNITS IN LACTATED RINGERS INFUSION - SIMPLE MED: 2.5000 [IU]/h | INTRAVENOUS | Status: DC

## 2016-05-26 MED ORDER — SCOPOLAMINE 1 MG/3DAYS TD PT72
MEDICATED_PATCH | TRANSDERMAL | Status: AC
  Filled 2016-05-26: qty 1

## 2016-05-26 MED ORDER — HYDRALAZINE HCL 20 MG/ML IJ SOLN
5.0000 mg | INTRAMUSCULAR | Status: DC | PRN
  Administered 2016-05-26: 5 mg via INTRAVENOUS

## 2016-05-26 MED ORDER — HYDRALAZINE HCL 20 MG/ML IJ SOLN
INTRAMUSCULAR | Status: AC
  Filled 2016-05-26: qty 1

## 2016-05-26 MED ORDER — DIPHENHYDRAMINE HCL 50 MG/ML IJ SOLN: 12.5000 mg | INTRAMUSCULAR | Status: DC | PRN

## 2016-05-26 MED ORDER — SCOPOLAMINE 1 MG/3DAYS TD PT72
MEDICATED_PATCH | TRANSDERMAL | Status: DC | PRN
  Administered 2016-05-26: 1 via TRANSDERMAL

## 2016-05-26 MED ORDER — MORPHINE SULFATE (PF) 0.5 MG/ML IJ SOLN
INTRAMUSCULAR | Status: DC | PRN
  Administered 2016-05-26: 3 mg via EPIDURAL

## 2016-05-26 MED ORDER — OXYCODONE-ACETAMINOPHEN 5-325 MG PO TABS: 2.0000 | ORAL_TABLET | ORAL | Status: DC | PRN

## 2016-05-26 MED ORDER — OXYTOCIN BOLUS FROM INFUSION: 500.0000 mL | Freq: Once | INTRAVENOUS | Status: DC

## 2016-05-26 MED ORDER — OXYTOCIN 10 UNIT/ML IJ SOLN
INTRAVENOUS | Status: DC | PRN
  Administered 2016-05-26: 40 [IU] via INTRAVENOUS

## 2016-05-26 MED ORDER — EPHEDRINE 5 MG/ML INJ: 10.0000 mg | INTRAVENOUS | Status: DC | PRN

## 2016-05-26 MED ORDER — MEPERIDINE HCL 25 MG/ML IJ SOLN
INTRAMUSCULAR | Status: AC
  Filled 2016-05-26: qty 1

## 2016-05-26 MED ORDER — PHENYLEPHRINE 40 MCG/ML (10ML) SYRINGE FOR IV PUSH (FOR BLOOD PRESSURE SUPPORT)
80.0000 ug | PREFILLED_SYRINGE | INTRAVENOUS | Status: DC | PRN
  Filled 2016-05-26: qty 10

## 2016-05-26 MED ORDER — LACTATED RINGERS IV SOLN
500.0000 mL | INTRAVENOUS | Status: DC | PRN
  Administered 2016-05-26 (×2): 1000 mL via INTRAVENOUS

## 2016-05-26 MED ORDER — SOD CITRATE-CITRIC ACID 500-334 MG/5ML PO SOLN
30.0000 mL | ORAL | Status: DC | PRN
  Administered 2016-05-26: 30 mL via ORAL
  Filled 2016-05-26: qty 15

## 2016-05-26 MED ORDER — MEPERIDINE HCL 25 MG/ML IJ SOLN
INTRAMUSCULAR | Status: DC | PRN
  Administered 2016-05-26 – 2016-05-27 (×2): 12.5 mg via INTRAVENOUS

## 2016-05-26 MED ORDER — FENTANYL 2.5 MCG/ML BUPIVACAINE 1/10 % EPIDURAL INFUSION (WH - ANES)
14.0000 mL/h | INTRAMUSCULAR | Status: DC | PRN
  Administered 2016-05-26 (×2): 14 mL/h via EPIDURAL
  Filled 2016-05-26 (×2): qty 100

## 2016-05-26 MED ORDER — CEFAZOLIN SODIUM-DEXTROSE 2-4 GM/100ML-% IV SOLN
INTRAVENOUS | Status: AC
  Filled 2016-05-26: qty 100

## 2016-05-26 MED ORDER — CEFAZOLIN SODIUM-DEXTROSE 2-4 GM/100ML-% IV SOLN: 2.0000 g | INTRAVENOUS | Status: DC

## 2016-05-26 MED ORDER — SODIUM CHLORIDE 0.9 % IV SOLN
3.0000 g | Freq: Four times a day (QID) | INTRAVENOUS | Status: DC
  Administered 2016-05-26: 3 g via INTRAVENOUS
  Filled 2016-05-26: qty 3

## 2016-05-26 MED ORDER — OXYTOCIN 40 UNITS IN LACTATED RINGERS INFUSION - SIMPLE MED
1.0000 m[IU]/min | INTRAVENOUS | Status: DC
  Administered 2016-05-26: 2 m[IU]/min via INTRAVENOUS
  Filled 2016-05-26: qty 1000

## 2016-05-26 MED ORDER — TERBUTALINE SULFATE 1 MG/ML IJ SOLN
0.2500 mg | Freq: Once | INTRAMUSCULAR | Status: DC | PRN
  Filled 2016-05-26: qty 1

## 2016-05-26 MED ORDER — MISOPROSTOL 25 MCG QUARTER TABLET
25.0000 ug | ORAL_TABLET | ORAL | Status: DC | PRN
  Administered 2016-05-26: 25 ug via VAGINAL
  Filled 2016-05-26 (×2): qty 0.25

## 2016-05-26 MED ORDER — CEFAZOLIN SODIUM-DEXTROSE 2-3 GM-% IV SOLR
INTRAVENOUS | Status: DC | PRN
  Administered 2016-05-26: 2 g via INTRAVENOUS

## 2016-05-26 MED ORDER — LIDOCAINE HCL (PF) 1 % IJ SOLN
INTRAMUSCULAR | Status: DC | PRN
  Administered 2016-05-26: 4 mL
  Administered 2016-05-26: 6 mL via EPIDURAL

## 2016-05-26 MED ORDER — BUPIVACAINE HCL (PF) 0.25 % IJ SOLN
INTRAMUSCULAR | Status: DC | PRN
  Administered 2016-05-26: 8 mL via EPIDURAL

## 2016-05-26 MED ORDER — LACTATED RINGERS IV SOLN
INTRAVENOUS | Status: DC
  Administered 2016-05-26 (×2): via INTRAUTERINE

## 2016-05-26 MED ORDER — LACTATED RINGERS IV SOLN
500.0000 mL | Freq: Once | INTRAVENOUS | Status: AC
  Administered 2016-05-26: 1000 mL via INTRAVENOUS

## 2016-05-26 MED ORDER — OXYTOCIN 10 UNIT/ML IJ SOLN
INTRAMUSCULAR | Status: AC
  Filled 2016-05-26: qty 4

## 2016-05-26 MED ORDER — TERBUTALINE SULFATE 1 MG/ML IJ SOLN
0.2500 mg | Freq: Once | INTRAMUSCULAR | Status: AC | PRN
  Administered 2016-05-26: 0.25 mg via SUBCUTANEOUS

## 2016-05-26 MED ORDER — PHENYLEPHRINE 40 MCG/ML (10ML) SYRINGE FOR IV PUSH (FOR BLOOD PRESSURE SUPPORT): 80.0000 ug | PREFILLED_SYRINGE | INTRAVENOUS | Status: DC | PRN

## 2016-05-26 MED ORDER — SODIUM BICARBONATE 8.4 % IV SOLN
INTRAVENOUS | Status: DC | PRN
  Administered 2016-05-26 (×3): 5 mL via EPIDURAL

## 2016-05-26 MED ORDER — LACTATED RINGERS IV SOLN
INTRAVENOUS | Status: DC
  Administered 2016-05-26 (×5): via INTRAVENOUS

## 2016-05-26 MED ORDER — DEXAMETHASONE SODIUM PHOSPHATE 4 MG/ML IJ SOLN
INTRAMUSCULAR | Status: AC
  Filled 2016-05-26: qty 1

## 2016-05-26 MED ORDER — DEXAMETHASONE SODIUM PHOSPHATE 4 MG/ML IJ SOLN
INTRAMUSCULAR | Status: DC | PRN
  Administered 2016-05-26: 4 mg via INTRAVENOUS

## 2016-05-26 MED ORDER — LIDOCAINE HCL (PF) 1 % IJ SOLN: 30.0000 mL | INTRAMUSCULAR | Status: DC | PRN

## 2016-05-26 SURGICAL SUPPLY — 39 items
CHLORAPREP W/TINT 26ML (MISCELLANEOUS) ×3 IMPLANT
CLAMP CORD UMBIL (MISCELLANEOUS) IMPLANT
CLOTH BEACON ORANGE TIMEOUT ST (SAFETY) ×3 IMPLANT
DERMABOND ADHESIVE PROPEN (GAUZE/BANDAGES/DRESSINGS) ×2
DERMABOND ADVANCED (GAUZE/BANDAGES/DRESSINGS)
DERMABOND ADVANCED .7 DNX12 (GAUZE/BANDAGES/DRESSINGS) IMPLANT
DERMABOND ADVANCED .7 DNX6 (GAUZE/BANDAGES/DRESSINGS) ×1 IMPLANT
DRAPE C SECTION CLR SCREEN (DRAPES) ×3 IMPLANT
DRSG OPSITE POSTOP 4X10 (GAUZE/BANDAGES/DRESSINGS) ×3 IMPLANT
ELECT REM PT RETURN 9FT ADLT (ELECTROSURGICAL) ×3
ELECTRODE REM PT RTRN 9FT ADLT (ELECTROSURGICAL) ×1 IMPLANT
EXTRACTOR VACUUM M CUP 4 TUBE (SUCTIONS) IMPLANT
EXTRACTOR VACUUM M CUP 4' TUBE (SUCTIONS)
GLOVE BIOGEL PI IND STRL 7.0 (GLOVE) ×2 IMPLANT
GLOVE BIOGEL PI INDICATOR 7.0 (GLOVE) ×4
GLOVE SURG SS PI 6.5 STRL IVOR (GLOVE) ×3 IMPLANT
GOWN STRL REUS W/TWL LRG LVL3 (GOWN DISPOSABLE) ×6 IMPLANT
KIT ABG SYR 3ML LUER SLIP (SYRINGE) IMPLANT
NEEDLE HYPO 25X5/8 SAFETYGLIDE (NEEDLE) IMPLANT
NS IRRIG 1000ML POUR BTL (IV SOLUTION) ×3 IMPLANT
PACK C SECTION WH (CUSTOM PROCEDURE TRAY) ×3 IMPLANT
PAD ABD 7.5X8 STRL (GAUZE/BANDAGES/DRESSINGS) ×3 IMPLANT
PAD OB MATERNITY 4.3X12.25 (PERSONAL CARE ITEMS) ×3 IMPLANT
PENCIL SMOKE EVAC W/HOLSTER (ELECTROSURGICAL) ×3 IMPLANT
RTRCTR C-SECT PINK 25CM LRG (MISCELLANEOUS) ×3 IMPLANT
SPONGE GAUZE 4X4 12PLY STER LF (GAUZE/BANDAGES/DRESSINGS) ×3 IMPLANT
SUT CHROMIC 1 CTX 36 (SUTURE) IMPLANT
SUT CHROMIC 2 0 CT 1 (SUTURE) ×3 IMPLANT
SUT MON AB 4-0 PS1 27 (SUTURE) ×3 IMPLANT
SUT PLAIN 1 NONE 54 (SUTURE) IMPLANT
SUT PLAIN 2 0 (SUTURE) ×2
SUT PLAIN 2 0 XLH (SUTURE) IMPLANT
SUT PLAIN ABS 2-0 CT1 27XMFL (SUTURE) ×1 IMPLANT
SUT VIC AB 0 CTX 36 (SUTURE) ×2
SUT VIC AB 0 CTX36XBRD ANBCTRL (SUTURE) ×1 IMPLANT
SUT VIC AB 1 CTX 36 (SUTURE) ×4
SUT VIC AB 1 CTX36XBRD ANBCTRL (SUTURE) ×2 IMPLANT
TOWEL OR 17X24 6PK STRL BLUE (TOWEL DISPOSABLE) ×3 IMPLANT
TRAY FOLEY CATH SILVER 14FR (SET/KITS/TRAYS/PACK) ×3 IMPLANT

## 2016-05-26 NOTE — Progress Notes (Signed)
Michele Pennington is a 25 y.o. G2P0010   Subjective: Pt with c/o lower back pain.  Pain with last cervical exam.  Objective: BP 137/75 (BP Location: Left Arm)   Pulse 98   Temp 99.7 F (37.6 C) (Oral)   Resp 18   Ht 5\' 3"  (1.6 m)   Wt 106.6 kg (235 lb)   SpO2 99%   BMI 41.63 kg/m  I/O last 3 completed shifts: In: -  Out: 550 [Urine:150; Emesis/NG output:400] Total I/O In: -  Out: 650 [Urine:650]  FHT:  FHR: 150s bpm, variability: moderate,  accelerations:  Present,  decelerations:  Present late,variable UC:   regular, every 2-3 minutes SVE:   Dilation: 6 Effacement (%): 80 Station: -2 Exam by:: Dr. Idamae SchullerVarnardo  Buldging bag AROM mostly clear, particles of meconium  Labs: Lab Results  Component Value Date   WBC 12.0 (H) 05/26/2016   HGB 11.2 (L) 05/26/2016   HCT 34.3 (L) 05/26/2016   MCV 73.4 (L) 05/26/2016   PLT 329 05/26/2016    Assessment / Plan: IUP @ 39 1/7 weeks S/p AROM.  Labor: Active labor.  Fetal intolerance to 2 mUs pitocin Preeclampsia:  BPs mild Fetal Wellbeing:  Category II improved with resuscitation. Pain Control:  Epidural I/D:  100.6 Temp, 5 min later 99.7.  Elevation in baseline suggestive of fever but returning to baseline.  Repeat Temp.  If elevated, start Unasyn. Anticipated MOD:  NSVD if labor tolerated.  Geryl RankinsVARNADO, Morganne Haile 05/26/2016, 7:59 PM

## 2016-05-26 NOTE — Anesthesia Pain Management Evaluation Note (Signed)
  CRNA Pain Management Visit Note  Patient: Michele Pennington, 25 y.o., female  "Hello [facility] am a [facility]. We have an anesthesia team available at all times to provide care throughout the hospital, including epidural management and anesthesia for C-section. I don't know your plan for the delivery whether it a natural birth, water birth, IV sedation, nitrous supplementation, doula or epidural, but we want to meet your pain goals."   1.Was your pain managed to your expectations on prior hospitalizations?   No prior hospitalizations  2.What is your expectation for pain management during this hospitalization?     Epidural  3.How can we help you reach that goal? epidural  Record the patient's initial score and the patient's pain goal.   Pain: 8  Pain Goal: 8 [facility] wants you to be able to say your pain was always managed very well.  Edison PaceWILKERSON,Michele Alfred 05/26/2016

## 2016-05-26 NOTE — Progress Notes (Signed)
Michele Pennington is a 25 y.o. G2P0010 at 6077w1d  Subjective: Pt with moderate discomfort with contractions. Denies headaches visual changes, RUQ pain.  Objective: BP 127/70   Pulse 79   Temp 98.7 F (37.1 C) (Oral)   Resp 16   Ht 5\' 3"  (1.6 m)   Wt 106.6 kg (235 lb)   BMI 41.63 kg/m  No intake/output data recorded. No intake/output data recorded.  FHT:  130s, reactive, good variability UC:   q 1-2 in the last 30 minutes. SVE:   1-2 cm/60/-3,soft,, midposition. Foley bulb placed blindly without complication. Labs: Lab Results  Component Value Date   WBC 12.1 (H) 05/26/2016   HGB 11.4 (L) 05/26/2016   HCT 33.8 (L) 05/26/2016   MCV 71.8 (L) 05/26/2016   PLT 337 05/26/2016    Assessment / Plan: IUP @ 39 1/7 weeks  Labor: Latent labor s/p Cytotec x 1 Preeclampsia:  labs stable and Hydralazine x 1 since admission.  BP normal. Fetal Wellbeing:  Category I Pain Control:  Epidural, IV pain meds and Nitrous Oxide I/D:  n/a Anticipated MOD:  NSVD  Michele Pennington 05/26/2016, 8:30 AM

## 2016-05-26 NOTE — Anesthesia Procedure Notes (Signed)

## 2016-05-26 NOTE — Progress Notes (Signed)
Patient ID: Michele Pennington, female   DOB: May 09, 1991, 25 y.o.   MRN: [id number]  Pt comfortable s/p epidural.  Foley bulb expelled while pt was in the bathroom. VSS Cat I tracing.   Cervix recently checked. Reassess in 2 hours.

## 2016-05-26 NOTE — Progress Notes (Signed)
Marizol M Bucknam MRN: [id number]  Subjective: -Nurse call reports recurrent late decelerations with resuscitation methods.  In room to assess.   Objective: BP (!) 178/96   Pulse (!) 103   Temp 100 F (37.8 C) (Axillary)   Resp 18   Ht 5' 3" (1.6 m)   Wt 106.6 kg (235 lb)   SpO2 99%   BMI 41.63 kg/m  I/O last 3 completed shifts: In: -  Out: 550 [Urine:150; Emesis/NG output:400] Total I/O In: -  Out: 850 [Urine:850]  Fetal Monitoring: FHT: 155 bpm, Mod Var, +Late Decels, -Accels UC: Q1-2min, MVUs 175mmHg    Vaginal Exam: SVE:   Dilation: 6 Effacement (%): 80 Station: -1 Exam by:: Damarrion Mimbs,CNM Membranes:AROM x 3hrs Internal Monitors: IUPC and FSE  Augmentation/Induction: Pitocin:None Cytotec: None  Assessment:  IUP at 39.1wks Cat II FT  CHTN IOL Fetal Intolerance  Plan: -Recommendation made to proceed with primary c/s -R/B reviewed including, but not limited to, infection, bleeding, pain, damage to organs or fetus resulting in need for additional surgery.  Patient understands and accepts these risks and wishes to proceed with c/s. -Dr. EK notified and en route -Prepare for C/S  Shavonna Corella L Mattheus Rauls,MSN, CNM 05/26/2016, 11:03 PM   

## 2016-05-26 NOTE — Progress Notes (Addendum)
Michele Pennington MRN: [id number]  Subjective: -Care assumed of 24y.o. G2P0 at 39.1wks who presents for IOL secondary [facility].  Patient is being followed by Michele Pennington and is under the care of Michele Pennington.  Patient pregnancy history significant for trichomoniasis at 35wks, fibroadenomas, and asymptomatic varicella +IgM.  In room to meet acquaintance with Michele Pennington.  Patient without q/c and reports intermittent discomfort in back and on right side.  Patient states similar to cramping.   Objective: BP (!) 148/74   Pulse 96   Temp 99.7 F (37.6 C) (Oral)   Resp 18   Ht 5\' 3"  (1.6 m)   Wt 106.6 kg (235 lb)   SpO2 99%   BMI 41.63 kg/m  I/O last 3 completed shifts: In: -  Out: 550 [Urine:150; Emesis/NG output:400] Total I/O In: -  Out: 650 [Urine:650]  Fetal Monitoring: FHT: 165 bpm, Mod Var, -Decels, +Accels UC: Qmin, palpates strong    Vaginal Exam: SVE:   Dilation: 6 Effacement (%): 80 Station: -1 Exam by:: Michele Pennington Membranes:AROM with clear fluid, but particulate meconium noted Internal Monitors: IUPC inserted by Michele Pennington  Augmentation/Induction: Pitocin:None Cytotec: None  Assessment:  IUP at 39.1wks Cat I FT  CHTN Active Labor Amniotomy Low Grade Fever Discomfort despite Epidural  Plan: -R/B of amniotomy discussed -R/B of IUPC discussed -Will retake temperature in 2 hours and start anbx as appropriate -Will call anesthesia to assess patient's discomfort -Michele Pennington updated on patient status by Dr. Enid BaasEV -Continue other mgmt as ordered  Michele CavaJessica L Mily Malecki,MSN, CNM 05/26/2016, 8:24 PM  Addendum (2045) S: Nurse call reports recurrent late decelerations.  O: 155 bpm, Mod Var, +Late Decels, -Accels MVUs ~3500mmHg VE: 6/80/-1 w/ +Scalp Stim  A: Cat II FT Low Grade Fever  P: Discussed r/b of fetal scalp electrode including infection, trauma to fetal scalp, and ability to monitor fetal heart rate more accurately.  Patient w/o q/c and agrees to placement.   Unasyn ordered: 3g Q6hrs Michele Pennington updated on patient status and advises amnioinfusion Will continue to monitor  Michele RobinsJessica L Stephfon Bovey MSN, CNM 05/26/2016 9:41 PM

## 2016-05-26 NOTE — Anesthesia Preprocedure Evaluation (Signed)
Anesthesia Evaluation  Patient identified by MRN, date of birth, ID band Patient awake    Reviewed: Allergy & Precautions, H&P , Patient's Chart, lab work & pertinent test results  Airway Mallampati: II  TM Distance: >3 FB Neck ROM: full    Dental  (+) Teeth Intact   Pulmonary    breath sounds clear to auscultation       Cardiovascular hypertension,  Rhythm:regular Rate:Normal     Neuro/Psych    GI/Hepatic   Endo/Other  Morbid obesity  Renal/GU      Musculoskeletal   Abdominal   Peds  Hematology   Anesthesia Other Findings       Reproductive/Obstetrics (+) Pregnancy                             Anesthesia Physical Anesthesia Plan  ASA: III  Anesthesia Plan: Epidural   Post-op Pain Management:    Induction:   Airway Management Planned:   Additional Equipment:   Intra-op Plan:   Post-operative Plan:   Informed Consent: I have reviewed the patients History and Physical, chart, labs and discussed the procedure including the risks, benefits and alternatives for the proposed anesthesia with the patient or authorized representative who has indicated his/her understanding and acceptance.   Dental Advisory Given  Plan Discussed with:   Anesthesia Plan Comments: (Labs checked- platelets confirmed with RN in room. Fetal heart tracing, per RN, reported to be stable enough for sitting procedure. Discussed epidural, and patient consents to the procedure:  included risk of possible headache,backache, failed block, allergic reaction, and nerve injury. This patient was asked if she had any questions or concerns before the procedure started.)        Anesthesia Quick Evaluation  

## 2016-05-26 NOTE — Progress Notes (Addendum)
Michele Pennington is a 25 y.o. G2P0010 at 3331w1d   Subjective: Pt comfortable s/p epidural.  Has a slight headache.  Denies visual changes or RUQ pain.  Objective: BP (!) 144/89   Pulse 87   Temp 100 F (37.8 C) (Axillary)   Resp 20   Ht 5\' 3"  (1.6 m)   Wt 106.6 kg (235 lb)   SpO2 99%   BMI 41.63 kg/m  No intake/output data recorded. Total I/O In: -  Out: 150 [Urine:150]  FHT:  FHR: 130s bpm, variability: moderate,  accelerations:  Present,  decelerations:  Absent UC:   regular, every 2 minutes SVE:   Dilation: 5 Effacement (%): 80 Station: -3, Ballotable Exam by:: Michele SlipperJane Bailey, RN Neuro:  LE reflexes not illicited. Labs: Lab Results  Component Value Date   WBC 12.0 (H) 05/26/2016   HGB 11.2 (L) 05/26/2016   HCT 34.3 (L) 05/26/2016   MCV 73.4 (L) 05/26/2016   PLT 329 05/26/2016   PCR 0.13  Assessment / Plan: IUP @ 39 1/7 weeks S/p Foley bulb  Labor: Unchanged.  Start Pitocin 1 x 1 mU. AROM when head engaged. Preeclampsia:  labs stable and BP mild Fetal Wellbeing:  Category I Pain Control:  Epidural I/D:  n/a Anticipated MOD:  NSVD  Michele Pennington 05/26/2016, 5:31 PM

## 2016-05-27 ENCOUNTER — Encounter (HOSPITAL_COMMUNITY): Payer: Self-pay

## 2016-05-27 LAB — CBC
HEMATOCRIT: 30 % — AB (ref 36.0–46.0)
Hemoglobin: 9.8 g/dL — ABNORMAL LOW (ref 12.0–15.0)
MCH: 23.7 pg — ABNORMAL LOW (ref 26.0–34.0)
MCHC: 32.7 g/dL (ref 30.0–36.0)
MCV: 72.6 fL — ABNORMAL LOW (ref 78.0–100.0)
PLATELETS: 279 10*3/uL (ref 150–400)
RBC: 4.13 MIL/uL (ref 3.87–5.11)
RDW: 19.3 % — AB (ref 11.5–15.5)
WBC: 19.3 10*3/uL — AB (ref 4.0–10.5)

## 2016-05-27 MED ORDER — HYDRALAZINE HCL 20 MG/ML IJ SOLN
INTRAMUSCULAR | Status: AC
  Filled 2016-05-27: qty 1

## 2016-05-27 MED ORDER — FENTANYL CITRATE (PF) 100 MCG/2ML IJ SOLN: 25.0000 ug | INTRAMUSCULAR | Status: DC | PRN

## 2016-05-27 MED ORDER — NALOXONE HCL 0.4 MG/ML IJ SOLN: 0.4000 mg | INTRAMUSCULAR | Status: DC | PRN

## 2016-05-27 MED ORDER — NALBUPHINE HCL 10 MG/ML IJ SOLN: 5.0000 mg | INTRAMUSCULAR | Status: DC | PRN

## 2016-05-27 MED ORDER — NALBUPHINE HCL 10 MG/ML IJ SOLN
5.0000 mg | INTRAMUSCULAR | Status: DC | PRN
  Administered 2016-05-27 (×2): 5 mg via INTRAVENOUS
  Filled 2016-05-27 (×2): qty 1

## 2016-05-27 MED ORDER — ONDANSETRON HCL 4 MG/2ML IJ SOLN: 4.0000 mg | Freq: Three times a day (TID) | INTRAMUSCULAR | Status: DC | PRN

## 2016-05-27 MED ORDER — OXYCODONE-ACETAMINOPHEN 5-325 MG PO TABS: 2.0000 | ORAL_TABLET | ORAL | Status: DC | PRN

## 2016-05-27 MED ORDER — LACTATED RINGERS IV SOLN: INTRAVENOUS | Status: DC

## 2016-05-27 MED ORDER — PRENATAL MULTIVITAMIN CH
1.0000 | ORAL_TABLET | Freq: Every day | ORAL | Status: DC
  Administered 2016-05-27 – 2016-05-28 (×2): 1 via ORAL
  Filled 2016-05-27 (×2): qty 1

## 2016-05-27 MED ORDER — SIMETHICONE 80 MG PO CHEW
80.0000 mg | CHEWABLE_TABLET | ORAL | Status: DC
  Administered 2016-05-27 – 2016-05-28 (×2): 80 mg via ORAL
  Filled 2016-05-27 (×2): qty 1

## 2016-05-27 MED ORDER — KETOROLAC TROMETHAMINE 30 MG/ML IJ SOLN
30.0000 mg | Freq: Four times a day (QID) | INTRAMUSCULAR | Status: AC | PRN
  Administered 2016-05-27: 30 mg via INTRAVENOUS

## 2016-05-27 MED ORDER — METOCLOPRAMIDE HCL 5 MG/ML IJ SOLN: 10.0000 mg | Freq: Once | INTRAMUSCULAR | Status: DC | PRN

## 2016-05-27 MED ORDER — OXYTOCIN 40 UNITS IN LACTATED RINGERS INFUSION - SIMPLE MED: 2.5000 [IU]/h | INTRAVENOUS | Status: AC

## 2016-05-27 MED ORDER — MEPERIDINE HCL 25 MG/ML IJ SOLN
INTRAMUSCULAR | Status: AC
  Administered 2016-05-27: 6.25 mg
  Filled 2016-05-27: qty 1

## 2016-05-27 MED ORDER — ACETAMINOPHEN 325 MG PO TABS
650.0000 mg | ORAL_TABLET | ORAL | Status: DC | PRN
  Administered 2016-05-27 – 2016-05-29 (×3): 650 mg via ORAL
  Filled 2016-05-27 (×3): qty 2

## 2016-05-27 MED ORDER — SODIUM CHLORIDE 0.9% FLUSH
3.0000 mL | INTRAVENOUS | Status: DC | PRN
  Administered 2016-05-27 (×2): 3 mL via INTRAVENOUS
  Filled 2016-05-27 (×2): qty 3

## 2016-05-27 MED ORDER — SIMETHICONE 80 MG PO CHEW: 80.0000 mg | CHEWABLE_TABLET | ORAL | Status: DC | PRN

## 2016-05-27 MED ORDER — OXYCODONE-ACETAMINOPHEN 5-325 MG PO TABS
1.0000 | ORAL_TABLET | ORAL | Status: DC | PRN
  Administered 2016-05-28: 1 via ORAL
  Filled 2016-05-27: qty 1

## 2016-05-27 MED ORDER — SENNOSIDES-DOCUSATE SODIUM 8.6-50 MG PO TABS
2.0000 | ORAL_TABLET | ORAL | Status: DC
  Administered 2016-05-27 – 2016-05-28 (×2): 2 via ORAL
  Filled 2016-05-27 (×2): qty 2

## 2016-05-27 MED ORDER — SODIUM CHLORIDE 0.9 % IR SOLN
Status: DC | PRN
  Administered 2016-05-27 (×2): 1

## 2016-05-27 MED ORDER — DEXTROSE 5 % IV SOLN
1.0000 ug/kg/h | INTRAVENOUS | Status: DC | PRN
Start: 2016-05-27 — End: 2016-05-29
  Filled 2016-05-27: qty 2

## 2016-05-27 MED ORDER — DIPHENHYDRAMINE HCL 50 MG/ML IJ SOLN: 12.5000 mg | INTRAMUSCULAR | Status: DC | PRN

## 2016-05-27 MED ORDER — SODIUM CHLORIDE 0.9 % IV SOLN
3.0000 g | Freq: Four times a day (QID) | INTRAVENOUS | Status: AC
  Administered 2016-05-27 (×3): 3 g via INTRAVENOUS
  Filled 2016-05-27 (×3): qty 3

## 2016-05-27 MED ORDER — MEPERIDINE HCL 25 MG/ML IJ SOLN
6.2500 mg | INTRAMUSCULAR | Status: DC | PRN
  Administered 2016-05-27: 6.25 mg via INTRAVENOUS

## 2016-05-27 MED ORDER — NIFEDIPINE ER OSMOTIC RELEASE 30 MG PO TB24
30.0000 mg | ORAL_TABLET | Freq: Every day | ORAL | Status: DC
  Administered 2016-05-27 – 2016-05-29 (×3): 30 mg via ORAL
  Filled 2016-05-27 (×3): qty 1

## 2016-05-27 MED ORDER — IBUPROFEN 600 MG PO TABS
600.0000 mg | ORAL_TABLET | Freq: Four times a day (QID) | ORAL | Status: DC
  Administered 2016-05-27 – 2016-05-29 (×9): 600 mg via ORAL
  Filled 2016-05-27 (×9): qty 1

## 2016-05-27 MED ORDER — MENTHOL 3 MG MT LOZG: 1.0000 | LOZENGE | OROMUCOSAL | Status: DC | PRN

## 2016-05-27 MED ORDER — DIPHENHYDRAMINE HCL 25 MG PO CAPS: 25.0000 mg | ORAL_CAPSULE | Freq: Four times a day (QID) | ORAL | Status: DC | PRN

## 2016-05-27 MED ORDER — SIMETHICONE 80 MG PO CHEW
80.0000 mg | CHEWABLE_TABLET | Freq: Three times a day (TID) | ORAL | Status: DC
  Administered 2016-05-27 – 2016-05-29 (×7): 80 mg via ORAL
  Filled 2016-05-27 (×6): qty 1

## 2016-05-27 MED ORDER — WITCH HAZEL-GLYCERIN EX PADS: 1.0000 "application " | MEDICATED_PAD | CUTANEOUS | Status: DC | PRN

## 2016-05-27 MED ORDER — KETOROLAC TROMETHAMINE 30 MG/ML IJ SOLN: 30.0000 mg | Freq: Four times a day (QID) | INTRAMUSCULAR | Status: AC | PRN

## 2016-05-27 MED ORDER — TETANUS-DIPHTH-ACELL PERTUSSIS 5-2.5-18.5 LF-MCG/0.5 IM SUSP: 0.5000 mL | Freq: Once | INTRAMUSCULAR | Status: DC

## 2016-05-27 MED ORDER — NALBUPHINE HCL 10 MG/ML IJ SOLN: 5.0000 mg | Freq: Once | INTRAMUSCULAR | Status: DC | PRN

## 2016-05-27 MED ORDER — HYDRALAZINE HCL 20 MG/ML IJ SOLN
5.0000 mg | INTRAMUSCULAR | Status: DC | PRN
  Administered 2016-05-27: 10 mg via INTRAVENOUS

## 2016-05-27 MED ORDER — COCONUT OIL OIL: 1.0000 "application " | TOPICAL_OIL | Status: DC | PRN

## 2016-05-27 MED ORDER — SODIUM CHLORIDE 0.9 % IV SOLN
3.0000 g | Freq: Four times a day (QID) | INTRAVENOUS | Status: DC
  Administered 2016-05-27: 3 g via INTRAVENOUS
  Filled 2016-05-27 (×4): qty 3

## 2016-05-27 MED ORDER — GUAIFENESIN ER 600 MG PO TB12
1200.0000 mg | ORAL_TABLET | Freq: Two times a day (BID) | ORAL | Status: DC
  Administered 2016-05-27 – 2016-05-29 (×4): 1200 mg via ORAL
  Filled 2016-05-27 (×6): qty 2

## 2016-05-27 MED ORDER — KETOROLAC TROMETHAMINE 30 MG/ML IJ SOLN
INTRAMUSCULAR | Status: AC
  Filled 2016-05-27: qty 1

## 2016-05-27 MED ORDER — DIPHENHYDRAMINE HCL 25 MG PO CAPS
25.0000 mg | ORAL_CAPSULE | ORAL | Status: DC | PRN
  Administered 2016-05-27: 25 mg via ORAL
  Filled 2016-05-27: qty 1

## 2016-05-27 MED ORDER — ZOLPIDEM TARTRATE 5 MG PO TABS: 5.0000 mg | ORAL_TABLET | Freq: Every evening | ORAL | Status: DC | PRN

## 2016-05-27 MED ORDER — SCOPOLAMINE 1 MG/3DAYS TD PT72
1.0000 | MEDICATED_PATCH | Freq: Once | TRANSDERMAL | Status: DC
  Filled 2016-05-27: qty 1

## 2016-05-27 MED ORDER — DIBUCAINE 1 % RE OINT: 1.0000 "application " | TOPICAL_OINTMENT | RECTAL | Status: DC | PRN

## 2016-05-27 NOTE — Lactation Note (Signed)
This note was copied from a baby's chart. Lactation Consultation Note Baby sleepy, has no interest in BF during the night. Baby will not open mouth for mom or RN. Suck training attempted w/LC, clamped down. Noted thick upper labial lip frenulum.  Mom has pendulum breast w/everted nipple at the end of breast. Compressible, no colostrum noted.  Referred to Baby and Me Book in Breastfeeding section Pg. 22-23 for position options and Proper latch demonstration. Educated about newborn behavior, encouraged STS, I&O, and newborn feeding habits. Mom encouraged to feed baby 8-12 times/24 hours and with feeding cues. Hand expression taught w/no colostrum noted.  WH/LC brochure given w/resources, support groups and LC services. Patient Name: Boy Benedetto CoonsCourtney Domenico ZOXWR'UToday's Date: 05/27/2016 Reason for consult: Initial assessment   Maternal Data Has patient been taught Hand Expression?: Yes Does the patient have breastfeeding experience prior to this delivery?: No  Feeding Feeding Type: Breast Fed Length of feed: 0 min (attempt)  LATCH Score/Interventions Latch: Too sleepy or reluctant, no latch achieved, no sucking elicited. Intervention(s): Skin to skin;Teach feeding cues;Waking techniques  Audible Swallowing: None Intervention(s): Skin to skin;Hand expression  Type of Nipple: Everted at rest and after stimulation  Comfort (Breast/Nipple): Soft / non-tender     Hold (Positioning): Assistance needed to correctly position infant at breast and maintain latch. Intervention(s): Breastfeeding basics reviewed;Support Pillows;Position options;Skin to skin  LATCH Score: 5  Lactation Tools Discussed/Used     Consult Status Consult Status: Follow-up Date: 05/27/16 Follow-up type: In-patient    Charyl DancerCARVER, Jevaughn Degollado G 05/27/2016, 7:14 AM

## 2016-05-27 NOTE — Interval H&P Note (Signed)
History and Physical Interval Note:  05/27/2016 12:39 AM  Michele Pennington  has presented today for surgery, with the diagnosis of C-Section for fetal intolerance  The various methods of treatment have been discussed with the patient and family. After consideration of risks, benefits and other options for treatment, the patient has consented to  Procedure(s): CESAREAN SECTION (N/A) as a surgical intervention .  The patient's history has been reviewed, patient examined, no change in status, stable for surgery.  I have reviewed the patient's chart and labs.  Questions were answered to the patient's satisfaction.     Konrad FelixKULWA,Celese Banner WAKURU, MD.

## 2016-05-27 NOTE — Op Note (Addendum)
Patient: Michele Pennington, Michele Pennington, M DOB: [birth year] MRN: [id number]   DATE OF SURGERY:  05/26/2016  PREOP DIAGNOSIS:  1. 39 week 1 day  EGA intrauterine pregnancy 2. Induction of labor for chronic hypertension   3.  Fetal intolerance to labor induction with abnormal fetal heart tracing- recurrent variable and late decelerations. 4.  Remote from delivery at 6 cm dilation.    POSTOP DIAGNOSIS: Same as above.  PROCEDURE: Primary low uterine segment transverse cesarean section via Pfannenstiel incision.     SURGEON: Dr.  Angus PalmsEMA Sallye OberKULWA  ASSISTANT: Gerrit HeckJessica Emly, CNM  ANESTHESIA: Epidural  COMPLICATIONS: None  FINDINGS: Viable female infant in cephalic presentation, LOP, Apgar scores of 8 and 9. Normal uterus and fallopian tubes and ovaries bilaterally.    EBL:  700 cc  IV FLUID:  2300 cc LR   URINE OUTPUT: 100 cc clearing urine  INDICATIONS: 25 y/o P0 who presented for labor induction as above. She got to 6cm dilation and was noted to have an abnormal fetal heart tracing with decelerations as mentioned above.  She received terbutaline and intrauterine resuscitation with continued abnormal tracing.  As she was remote from delivery with abnormal fetal heart tracing it was decided to proceed with a primary cesarean section.  She was consented for the procedure after explaining risks benefits and alternatives of the procedure including risks of bleeding, infection, damage to organs.    PROCEDURE:   She was taken to the operating room where her epidural anesthesia was found to be adequate. She was prepped and draped in the usual sterile fashion and a Foley catheter was placed. She received 2 g of IV Ancef preoperatively. A Pfannenstiel incision was made with the scalpel and the incision extended through the subcutaneous layer and also the fascia with the bovie. Small perforators in the subcutaneous layer were contained with the Bovie. The fascia was nicked in the midline and then was further separated  from the rectus muscles bilaterally using Mayo scissors. Kochers were placed inferiorly and then superiorly to allow further separation of fascia from the rectus muscles.  The peritoneal cavity was entered bluntly with the fingers. The Alexis retractor was placed in. The bladder flap was created using Metzenbaum scissors.   The uterus was incised with a scalpel and the incision extended bluntly bilaterally with fingers and on her left side with bandage scissors. Clear amniotic fluid was noted.  The head then the rest of the body was then delivered with abdominal pressure, funic presentation was also noted with fetal head delivery.  She delivered a viable female infant, apgar scores 8, 9.  The cord was clamped and cut. Cord blood was collected.    The uterus was not exteriorized.  The edges of the uterus was grasped with T. Clamps. The placenta was delivered with gentle traction on the umbilical cord. The uterus was cleared of clots and debris with a lap.  The incision uterine incision was closed with #1 Vicryl in a running locked stitch. An imbricating layer of same stitch was placed over the initial closure.  Irrigation was applied and suctioned out. Excellent hemostasis was noted over the incision.  The muscles were then reapproximated using chromic suture.  Fascia was closed using 0 Vicryl in a running stitch. The subcutaneous layer was irrigated and suctioned out. Small perforators were contained with the bovie.  The subcutaneous was closed over using 1-0 plain. The skin was closed using 4-0 Monocryl. Dermabond was applied. Honeycomb and pressure dressing were then applied. The patient  was then cleaned and she was taken to the recovery room in stable condition. The neonate was also taken to the recovery room with the patient in stable condition.   Unasyn will be continued in the postpartum period for patient's history of intrapartum fever and transient fetal tachycardia.  SPECIMEN: Placenta, umbilical  cord blood  DISPOSITION: TO PACU, STABLE.   Dr. Hoover BrownsEma Gildo Crisco 05/27/2016, 12.49 AM.

## 2016-05-27 NOTE — Brief Op Note (Signed)
05/26/2016 - 05/27/2016  12:39 AM  PATIENT:  Michele Pennington  24 y.o. female  PRE-OPERATIVE DIAGNOSIS:  C-Section for fetal intolerance of labor induction  POST-OPERATIVE DIAGNOSIS:  C-Section for fetal intolerance of labor induction  PROCEDURE:  Procedure(s): CESAREAN SECTION (N/A)  SURGEON:  Surgeon(s) and Role:    * Hoover BrownsEma Karlee Staff, MD - Primary  ASSISTANTS: Gerrit HeckJessica Emly, CNM   ANESTHESIA:   epidural  EBL:  Total I/O In: 2300 [I.V.:2300] Out: 1650 [Urine:950; Blood:700]  BLOOD ADMINISTERED:none  DRAINS: none   LOCAL MEDICATIONS USED:  NONE  SPECIMEN:  Source of Specimen:  Placenta to pathology  DISPOSITION OF SPECIMEN:  PATHOLOGY  COUNTS:  YES  TOURNIQUET:  * No tourniquets in log *  DICTATION: .Note written in EPIC  PLAN OF CARE: Admit to inpatient   PATIENT DISPOSITION:  PACU - hemodynamically stable.   Delay start of Pharmacological VTE agent (>24hrs) due to surgical blood loss or risk of bleeding: not applicable

## 2016-05-27 NOTE — Progress Notes (Signed)
[facility]ed BP at 0430 of 150/100 [facility]. Conflicting parameters in orders as to what values to call provider. No new orders received. Will continue to monitor pt.

## 2016-05-27 NOTE — H&P (View-Only) (Signed)
Michele Pennington MRN: [id number]  Subjective: -Nurse call reports recurrent late decelerations with resuscitation methods.  In room to assess.   Objective: BP (!) 178/96   Pulse (!) 103   Temp 100 F (37.8 C) (Axillary)   Resp 18   Ht 5\' 3"  (1.6 m)   Wt 106.6 kg (235 lb)   SpO2 99%   BMI 41.63 kg/m  I/O last 3 completed shifts: In: -  Out: 550 [Urine:150; Emesis/NG output:400] Total I/O In: -  Out: 850 [Urine:850]  Fetal Monitoring: FHT: 155 bpm, Mod Var, +Late Decels, -Accels UC: Q1-272min, MVUs 175mmHg    Vaginal Exam: SVE:   Dilation: 6 Effacement (%): 80 Station: -1 Exam by:: Michele Pennington,CNM Membranes:AROM x 3hrs Internal Monitors: IUPC and FSE  Augmentation/Induction: Pitocin:None Cytotec: None  Assessment:  IUP at 39.1wks Cat II FT  CHTN IOL Fetal Intolerance  Plan: -Recommendation made to proceed with primary c/s -R/B reviewed including, but not limited to, infection, bleeding, pain, damage to organs or fetus resulting in need for additional surgery.  Patient understands and accepts these risks and wishes to proceed with c/s. -Dr. Sallyanne HaversEK notified and en route -Prepare for C/S  [name],MSN, CNM 05/26/2016, 11:03 PM

## 2016-05-27 NOTE — Transfer of Care (Signed)
Immediate Anesthesia Transfer of Care Note  Patient: Michele Pennington  Procedure(s) Performed: Procedure(s): CESAREAN SECTION (N/A)  Patient Location: PACU  Anesthesia Type:Epidural  Level of Consciousness: awake, alert  and oriented  Airway & Oxygen Therapy: Patient Spontanous Breathing  Post-op Assessment: Report given to RN and Post -op Vital signs reviewed and stable  Post vital signs: Reviewed and stable  Last Vitals:  Vitals:   05/26/16 2130 05/26/16 2301  BP: (!) 178/96 (!) 153/57  Pulse: (!) 103 (!) 114  Resp:    Temp:  37.8 C    Last Pain:  Vitals:   05/26/16 2301  TempSrc: Axillary  PainSc:       Patients Stated Pain Goal: 7 (05/26/16 0730)  Complications: No apparent anesthesia complications

## 2016-05-27 NOTE — Lactation Note (Signed)
This note was copied from a baby's chart. Lactation Consultation Note  Patient Name: Michele Benedetto CoonsCourtney Herrington AVWUJ'WToday's Date: 05/27/2016 Reason for consult: Follow-up assessment Baby at 16 hr of life. RN reports baby is sleepy and will not open mouth wide. Baby has a cone shaped head and tight jaw. Showed parents how to do facial massage and tummy time while they are awake. Upon entry baby was sts with FOB and had a dirty diaper. After changing diaper baby was awake and cueing. Placed baby in football position on the R breast. Mom has short shaft nipples with a firm areola. Baby can latch but does not maintain for more than 2-3 sucks. Applied #20 NS and baby was able to maintain long bursts of sucking. Baby is still somewhat sleepy at the breast. Demonstrated to parents how to keep him awake to feed. There was scant colostrum in the NS when baby came off the breast. Baby has a wet mouth, good skin tone, and normal fontanel. Discussed baby behavior, feeding frequency, baby belly size, voids, wt loss, breast changes, and nipple care. Mom will offer the breast on demand 8+/24hr, use the NS as needed, and post pump. If she expresses any milk she will feed it back to baby with a spoon. She will call for lactation as needed.   Maternal Data    Feeding Feeding Type: Breast Fed Length of feed:  (still feeding when lactation left)  LATCH Score/Interventions Latch: Repeated attempts needed to sustain latch, nipple held in mouth throughout feeding, stimulation needed to elicit sucking reflex. Intervention(s): Waking techniques Intervention(s): Adjust position;Assist with latch;Breast massage;Breast compression  Audible Swallowing: A few with stimulation Intervention(s): Hand expression;Skin to skin Intervention(s): Alternate breast massage  Type of Nipple: Everted at rest and after stimulation  Comfort (Breast/Nipple): Soft / non-tender     Hold (Positioning): Full assist, staff holds infant at  breast Intervention(s): Support Pillows;Position options  LATCH Score: 6  Lactation Tools Discussed/Used Tools: Nipple Shields Nipple shield size: 20 WIC Program: No   Consult Status Consult Status: Follow-up Date: 05/28/16 Follow-up type: In-patient    Rulon Eisenmengerlizabeth E Berkeley Veldman 05/27/2016, 4:06 PM

## 2016-05-27 NOTE — Anesthesia Postprocedure Evaluation (Signed)
Anesthesia Post Note  Patient: Michele Pennington  Procedure(s) Performed: Procedure(s) (LRB): CESAREAN SECTION (N/A)  Patient location during evaluation: Mother Baby Anesthesia Type: Epidural Level of consciousness: awake and alert Pain management: pain level controlled Vital Signs Assessment: post-procedure vital signs reviewed and stable Respiratory status: spontaneous breathing and nonlabored ventilation Cardiovascular status: stable Postop Assessment: no headache, epidural receding, no backache, no signs of nausea or vomiting, patient able to bend at knees and adequate PO intake Anesthetic complications: no     Last Vitals:  Vitals:   05/27/16 0535 05/27/16 0547  BP: (!) 142/76 (!) 149/67  Pulse: 86 71  Resp: 18   Temp: 37 C     Last Pain:  Vitals:   05/27/16 0535  TempSrc: Oral  PainSc: 0-No pain   Pain Goal: Patients Stated Pain Goal: 3 (05/27/16 0154)               Laban EmperorMalinova,Astryd Pearcy Hristova

## 2016-05-27 NOTE — Progress Notes (Signed)
Subjective: Postop Day 1: Cesarean Delivery No complaints.  Pain controlled.  Lochia normal.  Breast feeding yes. Desires circumcision.  Objective: Temp:  [98.2 F (36.8 C)-100.6 F (38.1 C)] 98.2 F (36.8 C) (11/28 1334) Pulse Rate:  [70-131] 70 (11/28 1334) Resp:  [0-20] 18 (11/28 1334) BP: (133-178)/(57-109) 140/65 (11/28 1334) SpO2:  [96 %-99 %] 98 % (11/28 0535) Weight:  [102.7 kg (226 lb 6.4 oz)] 102.7 kg (226 lb 6.4 oz) (11/28 0556)  Physical Exam: Gen: NAD Lochia: Not visualized Uterine Fundus: firm, appropriately tender Incision: Pressure dressing in place DVT Evaluation: + Edema present, no calf tenderness bilaterally    Recent Labs  05/26/16 0949 05/27/16 0538  HGB 11.2* 9.8*  HCT 34.3* 30.0*    Assessment/Plan: Status post C-section-doing well postoperatively. Chronic HTN.  BP moderate.  Start Procardia XL 30 mg. Fever Intrapartum.  On Unasyn x 24 hours.  Currently afebrile. Pt consented for circumcision. Continue routine post op/postpartum care.    Geryl RankinsVARNADO, Michele Pennington 05/27/2016, 1:45 PM

## 2016-05-28 NOTE — Discharge Instructions (Addendum)
Cesarean Delivery, Care After  Refer to this sheet in the next few weeks. These instructions provide you with information about caring for yourself after your procedure. Your health care provider may also give you more specific instructions. Your treatment has been planned according to current medical practices, but problems sometimes occur. Call your health care provider if you have any problems or questions after your procedure. What can I expect after the procedure? After the procedure, it is common to have:  A small amount of blood or clear fluid coming from the incision.  Some redness, swelling, and pain in your incision area.  Some abdominal pain and soreness.  Vaginal bleeding (lochia).  Pelvic cramps.  Fatigue. Follow these instructions at home: Incision care  Follow instructions from your health care provider about how to take care of your incision. Make sure you:  Wash your hands with soap and water before you change your bandage (dressing). If soap and water are not available, use hand sanitizer.  Change your dressing as told by your health care provider.  Leave stitches (sutures), skin staples, skin glue, or adhesive strips in place. These skin closures may need to stay in place for 2 weeks or longer. If adhesive strip edges start to loosen and curl up, you may trim the loose edges. Do not remove adhesive strips completely unless your health care provider tells you to do that.  Check your incision area every day for signs of infection. Check for:  More redness, swelling, or pain.  More fluid or blood.  Warmth.  Pus or a bad smell.  When you cough or sneeze, hug a pillow. This helps with pain and decreases the chance of your incision opening up (dehiscing). Do this until your incision heals. Medicines  Take over-the-counter and prescription medicines only as told by your health care provider.  If you were prescribed an antibiotic medicine, take it as told by your  health care provider. Do not stop taking the antibiotic until it is finished. Driving  Do not drive or operate heavy machinery while taking prescription pain medicine.  Do not drive for 24 hours if you received a sedative. Lifestyle  Do not drink alcohol. This is especially important if you are breastfeeding or taking pain medicine.  Do not use tobacco products, including cigarettes, chewing tobacco, or e-cigarettes. If you need help quitting, ask your health care provider. Tobacco can delay wound healing. Eating and drinking  Drink at least 8 eight-ounce glasses of water every day unless told not to by your health care provider. If you breastfeed, you may need to drink more water than this.  Eat high-fiber foods every day. These foods may help prevent or relieve constipation. High-fiber foods include:  Whole grain cereals and breads.  Brown rice.  Beans.  Fresh fruits and vegetables. Activity  Return to your normal activities as told by your health care provider. Ask your health care provider what activities are safe for you.  Rest as much as possible. Try to rest or take a nap while your baby is sleeping.  Do not lift anything that is heavier than your baby or 10 lb (4.5 kg) as told by your health care provider.  Ask your health care provider when you can engage in sexual activity. This may depend on your:  Risk of infection.  Healing rate.  Comfort and desire to engage in sexual activity. Bathing  Do not take baths, swim, or use a hot tub until your health care provider  approves. Ask your health care provider if you can take showers. You may only be allowed to take sponge baths until your incision heals.  Keep your dressing dry as told by your health care provider. General instructions  Do not use tampons or douches until your health care provider approves.  Wear:  Loose, comfortable clothing.  A supportive and well-fitting bra.  Watch for any blood clots that  may pass from your vagina. These may look like clumps of dark red, brown, or black discharge.  Keep your perineum clean and dry as told by your health care provider.  Wipe from front to back when you use the toilet.  If possible, have someone help you care for your baby and help with household activities for a few days after you leave the hospital.  Keep all follow-up visits for you and your baby as told by your health care provider. This is important. Contact a health care provider if:  You have:  Bad-smelling vaginal discharge.  Difficulty urinating.  Pain when urinating.  A sudden increase or decrease in the frequency of your bowel movements.  More redness, swelling, or pain around your incision.  More fluid or blood coming from your incision.  Pus or a bad smell coming from your incision.  A fever.  A rash.  Little or no interest in activities you used to enjoy.  Questions about caring for yourself or your baby.  Nausea.  Your incision feels warm to the touch.  Your breasts turn red or become painful or hard.  You feel unusually sad or worried.  You vomit.  You pass large blood clots from your vagina. If you pass a blood clot, save it to show to your health care provider. Do not flush blood clots down the toilet without showing your health care provider.  You urinate more than usual.  You are dizzy or light-headed.  You have not breastfed and have not had a menstrual period for 12 weeks after delivery.  You stopped breastfeeding and have not had a menstrual period for 12 weeks after stopping breastfeeding. Get help right away if:  You have:  Pain that does not go away or get better with medicine.  Chest pain.  Difficulty breathing.  Blurred vision or spots in your vision.  Thoughts about hurting yourself or your baby.  New pain in your abdomen or in one of your legs.  A severe headache.  You faint.  You bleed from your vagina so much that  you fill two sanitary pads in one hour. This information is not intended to replace advice given to you by your health care provider. Make sure you discuss any questions you have with your health care provider. Document Released: 03/08/2002 Document Revised: 10/25/2015 Document Reviewed: 05/21/2015 Elsevier Interactive Patient Education  2017 [employer]   Iron-Rich Diet  Introduction Iron is a mineral that helps your body to produce hemoglobin. Hemoglobin is a protein in your red blood cells that carries oxygen to your body's tissues. Eating too little iron may cause you to feel weak and tired, and it can increase your risk for infection. Eating enough iron is necessary for your body's metabolism, muscle function, and nervous system. Iron is naturally found in many foods. It can also be added to foods or fortified in foods. There are two types of dietary iron:  Heme iron. Heme iron is absorbed by the body more easily than nonheme iron. Heme iron is found in meat, poultry, and fish.  Nonheme iron. Nonheme iron is found in dietary supplements, iron-fortified grains, beans, and vegetables. You may need to follow an iron-rich diet if:  You have been diagnosed with iron deficiency or iron-deficiency anemia.  You have a condition that prevents you from absorbing dietary iron, such as:  Infection in your intestines.  Celiac disease. This involves long-lasting (chronic) inflammation of your intestines.  You do not eat enough iron.  You eat a diet that is high in foods that impair iron absorption.  You have lost a lot of blood.  You have heavy bleeding during your menstrual cycle.  You are pregnant. What is my plan? Your health care provider may help you to determine how much iron you need per day based on your condition. Generally, when a person consumes sufficient amounts of iron in the diet, the following iron needs are met:  Men.  47-46 years old: 11 mg per day.  29-50 years  old: 8 mg per day.  Women.  13-76 years old: 15 mg per day.  55-9 years old: 18 mg per day.  Over 52 years old: 8 mg per day.  Pregnant women: 27 mg per day.  Breastfeeding women: 9 mg per day. What do I need to know about an iron-rich diet?  Eat fresh fruits and vegetables that are high in vitamin C along with foods that are high in iron. This will help increase the amount of iron that your body absorbs from food, especially with foods containing nonheme iron. Foods that are high in vitamin C include oranges, peppers, tomatoes, and mango.  Take iron supplements only as directed by your health care provider. Overdose of iron can be life-threatening. If you were prescribed iron supplements, take them with orange juice or a vitamin C supplement.  Cook foods in pots and pans that are made from iron.  Eat nonheme iron-containing foods alongside foods that are high in heme iron. This helps to improve your iron absorption.  Certain foods and drinks contain compounds that impair iron absorption. Avoid eating these foods in the same meal as iron-rich foods or with iron supplements. These include:  Coffee, black tea, and red wine.  Milk, dairy products, and foods that are high in calcium.  Beans, soybeans, and peas.  Whole grains.  When eating foods that contain both nonheme iron and compounds that impair iron absorption, follow these tips to absorb iron better.  Soak beans overnight before cooking.  Soak whole grains overnight and drain them before using.  Ferment flours before baking, such as using yeast in bread dough. What foods can I eat? Grains  Iron-fortified breakfast cereal. Iron-fortified whole-wheat bread. Enriched rice. Sprouted grains. Vegetables  Spinach. Potatoes with skin. Green peas. Broccoli. Red and green bell peppers. Fermented vegetables. Fruits  Prunes. Raisins. Oranges. Strawberries. Mango. Grapefruit. Meats and Other Protein Sources  Beef liver.  Oysters. Beef. Shrimp. [place]. Chicken. [place]. Sardines. Chickpeas. Nuts. Tofu. Beverages  Tomato juice. Fresh orange juice. Prune juice. Hibiscus tea. Fortified instant breakfast shakes. Condiments  Tahini. Fermented soy sauce. Sweets and Desserts  Black-strap molasses. Other  Wheat germ. The items listed above may not be a complete list of recommended foods or beverages. Contact your dietitian for more options.  What foods are not recommended? Grains  Whole grains. Bran cereal. Bran flour. Oats. Vegetables  Artichokes. Brussels sprouts. Kale. Fruits  Blueberries. Raspberries. Strawberries. Figs. Meats and Other Protein Sources  Soybeans. Products made from soy protein. Dairy  Milk. Cream. Cheese. Yogurt. Cottage cheese.  Beverages  Coffee. Black tea. Red wine. Sweets and Desserts  Cocoa. Chocolate. Ice cream. Other  Basil. Oregano. Parsley. The items listed above may not be a complete list of foods and beverages to avoid. Contact your dietitian for more information.  This information is not intended to replace advice given to you by your health care provider. Make sure you discuss any questions you have with your health care provider. Document Released: 01/28/2005 Document Revised: 01/04/2016 Document Reviewed: 01/11/2014  2017 Elsevier

## 2016-05-28 NOTE — Discharge Summary (Signed)
OB Discharge Summary     Patient Name: Michele Pennington DOB: [birth year] MRN: [id number]  Date of admission: 05/26/2016 Delivering MD: Hoover BrownsKULWA, EMA   Date of discharge: 05/28/2016  Admitting diagnosis: induction, Chronic HTN Intrauterine pregnancy: 6561w1d     Secondary diagnosis:  Active Problems:   Term pregnancy   Delivered by cesarean section  Additional problems: None     Discharge diagnosis: Term Pregnancy Delivered and CHTN                                                                                                Post partum procedures:None  Augmentation: Pitocin, Cytotec and Foley Balloon  Complications: None  Hospital course:  Induction of Labor With Cesarean Section  25 y.o. yo G2P1011 at 3761w1d was admitted to the hospital 05/26/2016 for induction of labor. Patient had a labor course significant for IOL. The patient went for cesarean section due to Fetal intolerance to labor, and delivered a Viable infant,@BABYSUPPRESS (DBLINK,ept,110,,1,,) Membrane Rupture Time/Date: )7:40 PM ,05/26/2016   @Details  of operation can be found in separate operative Note.  Patient had an uncomplicated postpartum course. She is ambulating, tolerating a regular diet, passing flatus, and urinating well.  Patient is discharged home in stable condition on 06/05/16.                                     Physical exam Vitals:   05/27/16 1946 05/27/16 2356 05/28/16 0643 05/28/16 0700  BP: (!) 143/67 125/62 131/75   Pulse: 95 78 80   Resp: 20 20 18    Temp: 98.9 F (37.2 C) 98.4 F (36.9 C) 98 F (36.7 C)   TempSrc:  Oral Oral   SpO2:      Weight:    103.9 kg (229 lb)  Height:       General: alert, cooperative and no distress Lochia: appropriate Uterine Fundus: firm Incision: Dressing is clean, dry, and intact DVT Evaluation: Negative Homan's sign. Labs: Lab Results  Component Value Date   WBC 19.3 (H) 05/27/2016   HGB 9.8 (L) 05/27/2016   HCT 30.0 (L) 05/27/2016   MCV 72.6  (L) 05/27/2016   PLT 279 05/27/2016   CMP Latest Ref Rng & Units 05/26/2016  Glucose 65 - 99 mg/dL 409(W107(H)  BUN 6 - 20 mg/dL 12  Creatinine 1.190.44 - 1.471.00 mg/dL 8.290.86  Sodium 562135 - 130145 mmol/L 135  Potassium 3.5 - 5.1 mmol/L 3.8  Chloride 101 - 111 mmol/L 104  CO2 22 - 32 mmol/L 21(L)  Calcium 8.9 - 10.3 mg/dL 9.0  Total Protein 6.5 - 8.1 g/dL 7.2  Total Bilirubin 0.3 - 1.2 mg/dL 0.5  Alkaline Phos 38 - 126 U/L 83  AST 15 - 41 U/L 17  ALT 14 - 54 U/L 12(L)    Discharge instruction: per After Visit Summary and "Baby and Me Booklet".  After visit meds:   Diet: low salt diet  Activity: Advance as tolerated. Pelvic rest for 6 weeks.   Outpatient follow up:2 weeks Follow up  Appt:No future appointments. Follow up Visit:No Follow-up on file.  Postpartum contraception: Discussed  Newborn Data: Live born female  Birth Weight: 7 lb 4.6 oz (3305 g) APGAR: 8, 9  Baby Feeding: Breast Disposition:home with mother   05/28/2016 Geryl RankinsVARNADO, Keeleigh Terris, MD

## 2016-05-28 NOTE — Progress Notes (Signed)
Subjective: Postop Day 2: Cesarean Delivery No complaints.  Pain controlled.  Lochia normal.  Breast feeding yes.  Difficulty with latch initially but improving. Congestion overnight, cold symptoms.  Improving with Mucinex.  Objective: Temp:  [98 F (36.7 C)-98.9 F (37.2 C)] 98 F (36.7 C) (11/29 0643) Pulse Rate:  [78-95] 80 (11/29 0643) Resp:  [16-20] 18 (11/29 0643) BP: (120-143)/(53-75) 131/75 (11/29 40980643) Weight:  [103.9 kg (229 lb)] 103.9 kg (229 lb) (11/29 0700)  Physical Exam: Gen: NAD, Pt upright in bathroom, just getting out of shower Lochia: Not visualized Incision: Dressing clean  DVT Evaluation: + Edema present    Recent Labs  05/26/16 0949 05/27/16 0538  HGB 11.2* 9.8*  HCT 34.3* 30.0*    Assessment/Plan: Status post C-section-doing well postoperatively. BP normal to mild on Procardia XL 30 mg. Doing well. Continue Mucinex. Encouraged ambulation in halls TID. Discharge in am.    Geryl RankinsVARNADO, Joaovictor Krone 05/28/2016, 1:48 PM

## 2016-05-28 NOTE — Lactation Note (Signed)
This note was copied from a baby's chart. Lactation Consultation Note Baby hasn't BF. Will not open mouth. Mom has #20 Ns. Fitted #16 NS. Attempted to latch, baby wouldn't open mouth. Suck training w/gloved finger attempted. Baby wasn't interested or even rooting. Had to stimulate mouth to open to get gloved finger in mouth to stimulate suckling on finger. Baby would finally suckle on gloved finger. Using curve tip syring gave Alimentum after not BF, and baby extremely jittery to upper and lower extremeties. Applied #16 NS,inserted Alimenetum into NS to get baby to suckle on breast. Baby wouldn't suck and had no interest in suckling. Instructed application. Notified RN of jitteriness and feel that need to check glucose. Heal stick by lab. Results were 59 after Alimentum just given. Encouraged mom to do STS and hand expression and suck training. Set up DEBP and got mom pumping since baby hasn't fed or stimulated breast. Patient Name: Boy Benedetto CoonsCourtney Vidana WUXLK'GToday's Date: 05/28/2016 Reason for consult: Follow-up assessment;Difficult latch   Maternal Data    Feeding Feeding Type: Breast Fed Length of feed: 5 min (off and on)  LATCH Score/Interventions Latch: Repeated attempts needed to sustain latch, nipple held in mouth throughout feeding, stimulation needed to elicit sucking reflex. Intervention(s): Skin to skin;Teach feeding cues Intervention(s): Adjust position;Assist with latch;Breast massage  Audible Swallowing: None Intervention(s): Skin to skin Intervention(s): Alternate breast massage  Type of Nipple: Flat (short shaft) Intervention(s): Double electric pump  Comfort (Breast/Nipple): Soft / non-tender     Hold (Positioning): Assistance needed to correctly position infant at breast and maintain latch. Intervention(s): Breastfeeding basics reviewed;Support Pillows;Position options;Skin to skin  LATCH Score: 5  Lactation Tools Discussed/Used Tools: Nipple Shields Nipple shield  size: 20 Breast pump type: Double-Electric Breast Pump Pump Review: Setup, frequency, and cleaning   Consult Status Consult Status: Follow-up Date: 05/28/16 Follow-up type: In-patient    Charyl DancerCARVER, Merrick Maggio G 05/28/2016, 7:55 AM

## 2016-05-29 MED ORDER — OXYCODONE-ACETAMINOPHEN 5-325 MG PO TABS: 1.0000 | ORAL_TABLET | ORAL | 0 refills | Status: DC | PRN

## 2016-05-29 MED ORDER — NIFEDIPINE ER 30 MG PO TB24: 30.0000 mg | ORAL_TABLET | Freq: Every day | ORAL | 1 refills | Status: DC

## 2016-05-29 MED ORDER — GUAIFENESIN ER 600 MG PO TB12: 1200.0000 mg | ORAL_TABLET | Freq: Two times a day (BID) | ORAL | 0 refills | Status: DC

## 2016-05-29 MED ORDER — IBUPROFEN 600 MG PO TABS: 600.0000 mg | ORAL_TABLET | Freq: Four times a day (QID) | ORAL | 1 refills | Status: DC | PRN

## 2016-05-29 NOTE — Lactation Note (Signed)
This note was copied from a baby's chart. Lactation Consultation Note  Patient Name: Michele Pennington CoonsCourtney Cantu ZOXWR'UToday's Date: 05/29/2016 Reason for consult: Follow-up assessment   With this mom of a term babyl, now 6860 hours old. The baby was fussy after his circumcision, and mom was having trouble latching him. When I walked in the room, mom had gotten baby latched deeply, with strong suckles and visible swallows. Mom knows to call, once home, for questions/coerns.    Maternal Data    Feeding Feeding Type: Breast Fed Length of feed: 12 min  LATCH Score/Interventions Latch: Repeated attempts needed to sustain latch, nipple held in mouth throughout feeding, stimulation needed to elicit sucking reflex. (baby fussy after circ) Intervention(s): Skin to skin;Teach feeding cues Intervention(s): Adjust position;Assist with latch  Audible Swallowing: Spontaneous and intermittent Intervention(s): Skin to skin;Hand expression (milk transntioning in)  Type of Nipple: Everted at rest and after stimulation Intervention(s): Hand pump  Comfort (Breast/Nipple): Soft / non-tender     Hold (Positioning): No assistance needed to correctly position infant at breast. Intervention(s): Breastfeeding basics reviewed;Support Pillows;Position options;Skin to skin  LATCH Score: 9  Lactation Tools Discussed/Used     Consult Status Consult Status: Complete Follow-up type: Call as needed    Alfred LevinsLee, Amritpal Shropshire Anne 05/29/2016, 12:39 PM

## 2016-05-29 NOTE — Progress Notes (Signed)
Subjective: Postpartum Day 3: Cesarean Delivery Patient reports tolerating PO, + flatus and no problems voiding.    Objective: Vital signs in last 24 hours: Temp:  [98.2 F (36.8 C)] 98.2 F (36.8 C) (11/30 0515) Pulse Rate:  [61-82] 61 (11/30 0515) Resp:  [18] 18 (11/30 0515) BP: (132-141)/(75-77) 132/75 (11/30 0515) Weight:  [102.6 kg (226 lb 3.2 oz)] 102.6 kg (226 lb 3.2 oz) (11/30 0515)  Physical Exam:  General: alert and cooperative Lochia: appropriate Uterine Fundus: firm Incision: healing well DVT Evaluation: No evidence of DVT seen on physical exam.   Recent Labs  05/26/16 0949 05/27/16 0538  HGB 11.2* 9.8*  HCT 34.3* 30.0*    Assessment/Plan: Status post Cesarean section. Doing well postoperatively.  Discharge home pt to return to see Dr. [name] in 2 wks for postpartum visit .  Mandeep Kiser J. 05/29/2016, 8:19 AM

## 2016-05-29 NOTE — Plan of Care (Signed)
Problem: Pain Management: Goal: General experience of comfort will improve and pain level will decrease Outcome: Progressing Pt continues to have incisional pain.  Medication and hot packs seem to relieve pain.

## 2016-05-29 NOTE — Lactation Note (Signed)
This note was copied from a baby's chart. Lactation Consultation Note  Patient Name: Boy Benedetto CoonsCourtney Weisel RUEAV'WToday's Date: 05/29/2016 Reason for consult: Follow-up assessment  With this mom of a term baby, now at 8 % weight loss and 57 hours old. On exam, mom's breasts are full, but soft, with easily expressed transitional milk. Mom had been using a nipple shield, but latched the baby with out a shield this time. He latched well, strong suckles and intermittent deep swallows, good breast movement, and mom's nipple round and normal after feeding. I showed mom how to latch baby with an asymmetrical latch, and how to position her self and baby so that his nose is at her nipple, and mom is holding the baby below his head, and she waits to latch until baby has a wide, open mouth.  Mom was shown how to use the manual hand pump, and was advised to pump to comfort if baby does not do so with breastfeeding, and feed EBM as supplement to  baby, either with curved tip syringe, or with bottle. Mom knows how to use curved tip syringe. Breast care, engorgement care also reviewed, and mom knows to call lactation as need, with questions/conerns.  Maternal Data    Feeding Feeding Type: Breast Fed Length of feed: 12 min  LATCH Score/Interventions Latch: Grasps breast easily, tongue down, lips flanged, rhythmical sucking. Intervention(s): Skin to skin;Teach feeding cues Intervention(s): Adjust position;Assist with latch  Audible Swallowing: Spontaneous and intermittent (visual) Intervention(s): Skin to skin;Hand expression (milk transntioning in)  Type of Nipple: Everted at rest and after stimulation Intervention(s): Hand pump  Comfort (Breast/Nipple): Soft / non-tender     Hold (Positioning): Assistance needed to correctly position infant at breast and maintain latch. Intervention(s): Breastfeeding basics reviewed;Support Pillows;Position options;Skin to skin  LATCH Score: 9  Lactation Tools  Discussed/Used     Consult Status Consult Status: Complete Follow-up type: Call as needed    Alfred LevinsLee, Nathan Stallworth Anne 05/29/2016, 10:30 AM

## 2016-06-03 ENCOUNTER — Ambulatory Visit (HOSPITAL_COMMUNITY)
Admission: RE | Admit: 2016-06-03 | Discharge: 2016-06-03 | Disposition: A | Discharge status: Home / Self Care | Payer: BLUE CROSS/BLUE SHIELD | Source: Ambulatory Visit | Attending: Obstetrics & Gynecology | Admitting: Obstetrics & Gynecology

## 2016-06-03 NOTE — Lactation Note (Signed)
Lactation Consult for [name] (mother) and Michele Pennington (DOB: [birth year])   Mother's reason for visit: "referral from pediatrician and personal concerns" Consult:  Initial Lactation Consultant:  Michele Pennington  ________________________________________________________________________ BW: 2449P (7# 4.6oz) D/c wt: 6# 11.4oz (down 7.9%) 06-03-16: 7# 3 oz (at doctor's office)  [facility]'s weight at [facility] visit: 7# 3.6oz    _______________________________________________________________________  Mother's Name: Michele Pennington Type of delivery:  C/S  Breastfeeding Experience: primip Maternal Medical Conditions:  chronic HTN Maternal Medications: IB, PNV, Iron. Mom is also on a diuretic or other BP medication, but she cannot remember the name ________________________________________________________________________  Breastfeeding History (Post Discharge)  Frequency of breastfeeding: 2-3hrs Duration of feeding: 10-45 min  Pumping Type of pump:  Harmony. Is eligible for a DEBP through her insurance, which she is going to pursue. Mom shown how to assemble & use hand pump that was included in pump kit.  Frequency: when he won't latch. Last pumped on Sunday Volume: 20-30 ml/session  Bottle feeding: 2-3 bottles/day; 8m of EBM or Similac. Last bottle was yesterday.   Infant Intake and Output Assessment  Voids: 7-8 in 24 hrs.  Color:  Clear yellow Stools:  4-5 in 24 hrs.  Color:  Yellow  ________________________________________________________________________  Maternal Breast Assessment  Breast:  Full Nipple:  Scabs on R nipple   _______________________________________________________________________ Feeding Assessment/Evaluation  Initial feeding assessment:  Infant's oral assessment:  WNL  Attached assessment:  Deep  Lips flanged:  Yes.    Suck assessment:  Nutritive  Pre-feed weight: 3278 g Post-feed weight: 3320 g  Amount transferred: 42 ml R breast,  cross-cradle, 30 minute feeding.   Pre-feed weight:3320 g Post-feed weight: 3346 g  Amount transferred: 46 ml L breast, 15 min  Total amount transferred: 88  ml  Infant is 832days old & is almost back to BW. Mom is no longer using a nipple shield (last used 4 days ago). I assisted Mom w/increasing her comfort during nursing (e.g. lowering infant's mandible to widen gape, having enough pillows sot that infant was at the level of the nipple, etc). At one point she commented, "This doesn't hurt at all."   Mom's R nipple has a scab that appears to be in the final stages of healing. Her R nipple looked perfectly rounded when infant released latch. Mom's L nipple was atraumatic (except for a residual crack at base, but it does not cause mother pain).   Plan:  1. Feed infant as he desires, keeping in mind that he will soon be going through a growth spurt.  2. Offer the 2nd breast at feedings (Mom typically just offers one; I pointed out to her that GHaze Boydendoubled the amount he transferred by taking the 2nd breast).    I have no concerns. Michele Dodge RN, IBCLC

## 2016-06-09 ENCOUNTER — Ambulatory Visit (HOSPITAL_COMMUNITY): Payer: BLUE CROSS/BLUE SHIELD

## 2016-08-04 ENCOUNTER — Other Ambulatory Visit: Payer: Self-pay | Admitting: Obstetrics and Gynecology

## 2016-08-04 DIAGNOSIS — N63 Unspecified lump in unspecified breast: Secondary | ICD-10-CM

## 2016-08-15 ENCOUNTER — Ambulatory Visit
Admission: RE | Admit: 2016-08-15 | Discharge: 2016-08-15 | Disposition: A | Discharge status: Home / Self Care | Payer: BLUE CROSS/BLUE SHIELD | Source: Ambulatory Visit | Attending: Obstetrics and Gynecology | Admitting: Obstetrics and Gynecology

## 2016-08-15 DIAGNOSIS — N63 Unspecified lump in unspecified breast: Secondary | ICD-10-CM

## 2016-08-18 ENCOUNTER — Other Ambulatory Visit: Payer: Self-pay | Admitting: Obstetrics and Gynecology

## 2016-08-18 DIAGNOSIS — N63 Unspecified lump in unspecified breast: Secondary | ICD-10-CM

## 2016-08-20 ENCOUNTER — Ambulatory Visit
Admission: RE | Admit: 2016-08-20 | Discharge: 2016-08-20 | Disposition: A | Discharge status: Home / Self Care | Payer: BLUE CROSS/BLUE SHIELD | Source: Ambulatory Visit | Attending: Obstetrics and Gynecology | Admitting: Obstetrics and Gynecology

## 2016-08-20 ENCOUNTER — Other Ambulatory Visit: Payer: Self-pay | Admitting: Obstetrics and Gynecology

## 2016-08-20 DIAGNOSIS — N63 Unspecified lump in unspecified breast: Secondary | ICD-10-CM

## 2017-05-04 ENCOUNTER — Telehealth (HOSPITAL_COMMUNITY): Payer: Self-pay | Admitting: Lactation Services

## 2017-05-04 NOTE — Telephone Encounter (Signed)
Patient is 11 months postpartum & her milk supply is dwindling. 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 Patient was encouraged to fit in more pumping sessions at work, if possible. Since patient is salaried and not an hourly employee, I encouraged her to call her school system to see if they have policies to protect breastfeeding employees. Tips with pumping at work also provided. Patient's questions about expressing milk when her son is with his dad were also answered.   Glenetta HewKim Malayia Spizzirri, RN, IBCLC

## 2017-07-02 ENCOUNTER — Other Ambulatory Visit: Payer: Self-pay | Admitting: Obstetrics and Gynecology

## 2017-07-02 ENCOUNTER — Other Ambulatory Visit (HOSPITAL_COMMUNITY)
Admission: RE | Admit: 2017-07-02 | Discharge: 2017-07-02 | Disposition: A | Discharge status: Home / Self Care | Payer: BC Managed Care – PPO | Source: Ambulatory Visit | Attending: Obstetrics and Gynecology | Admitting: Obstetrics and Gynecology

## 2017-07-02 DIAGNOSIS — Z01419 Encounter for gynecological examination (general) (routine) without abnormal findings: Secondary | ICD-10-CM | POA: Insufficient documentation

## 2017-07-07 LAB — CYTOLOGY - PAP: DIAGNOSIS: NEGATIVE

## 2017-11-17 ENCOUNTER — Ambulatory Visit
Admission: EM | Admit: 2017-11-17 | Discharge: 2017-11-17 | Disposition: A | Discharge status: Home / Self Care | Payer: BC Managed Care – PPO | Attending: Family Medicine | Admitting: Family Medicine

## 2017-11-17 ENCOUNTER — Other Ambulatory Visit: Payer: Self-pay

## 2017-11-17 ENCOUNTER — Encounter: Payer: Self-pay | Admitting: Emergency Medicine

## 2017-11-17 DIAGNOSIS — R05 Cough: Secondary | ICD-10-CM | POA: Diagnosis not present

## 2017-11-17 DIAGNOSIS — J029 Acute pharyngitis, unspecified: Secondary | ICD-10-CM | POA: Diagnosis not present

## 2017-11-17 DIAGNOSIS — J069 Acute upper respiratory infection, unspecified: Secondary | ICD-10-CM | POA: Diagnosis not present

## 2017-11-17 DIAGNOSIS — R0981 Nasal congestion: Secondary | ICD-10-CM

## 2017-11-17 LAB — RAPID STREP SCREEN (MED CTR MEBANE ONLY): Streptococcus, Group A Screen (Direct): NEGATIVE

## 2017-11-17 MED ORDER — IPRATROPIUM BROMIDE 0.06 % NA SOLN: 2.0000 | Freq: Four times a day (QID) | NASAL | 0 refills | Status: DC | PRN

## 2017-11-17 MED ORDER — IBUPROFEN 800 MG PO TABS: 800.0000 mg | ORAL_TABLET | Freq: Three times a day (TID) | ORAL | 0 refills | Status: DC | PRN

## 2017-11-17 NOTE — ED Provider Notes (Signed)
[facility]    CSN: [id number] Arrival date & time: 11/17/17  4098  History   Chief Complaint Chief Complaint  Patient presents with  . Sore Throat  . Cough  . Fever   HPI  27 year old female presents with the above complaints.  Symptoms started on Saturday.  She reports sore throat, cough, congestion.  She reports a fever of 99.4.  She is afebrile currently.  No documented true fever.  No medications or interventions tried.  No known exacerbating relieving factors.  Patient is currently breast-feeding.  No other complaints at this time.  Past Medical History:  Diagnosis Date  . Chlamydia   . Hypertension    Patient Active Problem List   Diagnosis Date Noted  . Delivered by cesarean section 05/27/2016  . Term pregnancy 05/26/2016   Past Surgical History:  Procedure Laterality Date  . CESAREAN SECTION N/A 05/26/2016   Procedure: CESAREAN SECTION;  Surgeon: Hoover Browns, MD;  Location: [facility];  Service: Obstetrics;  Laterality: N/A;  . NO PAST SURGERIES      OB History    Gravida  2   Para  1   Term  1   Preterm  0   AB  1   Living  1     SAB  0   TAB  1   Ectopic  0   Multiple  0   Live Births  1            Home Medications    Prior to Admission medications   Medication Sig Start Date End Date Taking? Authorizing Provider  Prenatal MV-Min-FA-Omega-3 (PRENATAL GUMMIES/DHA & FA) 0.4-32.5 MG CHEW Chew 2 each by mouth daily.   Yes [provider]  Fe Fum-FePoly-Vit C-Vit B3 (INTEGRA) 62.5-62.5-40-3 MG CAPS Take 1 capsule by mouth daily at 12 noon.     [provider]  ibuprofen (ADVIL,MOTRIN) 800 MG tablet Take 1 tablet (800 mg total) by mouth every 8 (eight) hours as needed. 11/17/17   Everlene Other G, DO  ipratropium (ATROVENT) 0.06 % nasal spray Place 2 sprays into both nostrils 4 (four) times daily as needed for rhinitis. 11/17/17   Tommie Sams, DO    Family History Family History  Problem Relation Age  of Onset  . Diabetes Mother   . Hypertension Mother   . Cancer Mother   . Diabetes Maternal Aunt   . Diabetes Maternal Grandmother     Social History Social History   Tobacco Use  . Smoking status: Never Smoker  . Smokeless tobacco: Never Used  Substance Use Topics  . Alcohol use: No  . Drug use: No     Allergies   Patient has no known allergies.   Review of Systems Review of Systems  HENT: Positive for congestion and sore throat.   Respiratory: Positive for cough.    Physical Exam Triage Vital Signs ED Triage Vitals  Enc Vitals Group     BP 11/17/17 0952 (!) 156/104     Pulse Rate 11/17/17 0952 73     Resp 11/17/17 0952 16     Temp 11/17/17 0952 98.6 F (37 C)     Temp Source 11/17/17 0952 Oral     SpO2 11/17/17 0952 100 %     Weight 11/17/17 0949 210 lb (95.3 kg)     Height 11/17/17 0949  (1.6 m)     Head Circumference --      Peak Flow --  Pain Score 11/17/17 0949 5     Pain Loc --      Pain Edu? --      Excl. in GC? --    Updated Vital Signs BP (!) 156/104 (BP Location: Left Arm)   Pulse 73   Temp 98.6 F (37 C) (Oral)   Resp 16   Ht  (1.6 m)   Wt 210 lb (95.3 kg)   SpO2 100%   Breastfeeding? Yes   BMI 37.20 kg/m   Physical Exam  Constitutional: She is oriented to person, place, and time. She appears well-developed. No distress.  HENT:  Head: Normocephalic and atraumatic.  Oropharynx with enlarged tonsils and erythema.  Eyes: Conjunctivae are normal. Right eye exhibits no discharge. Left eye exhibits no discharge.  Neck: Neck supple.  Cardiovascular: Normal rate and regular rhythm.  Pulmonary/Chest: Effort normal and breath sounds normal. She has no wheezes. She has no rales.  Lymphadenopathy:    She has no cervical adenopathy.  Neurological: She is alert and oriented to person, place, and time.  Psychiatric: She has a normal mood and affect. Her behavior is normal.  Nursing note and vitals reviewed.  UC Treatments /  Results  Labs (all labs ordered are listed, but only abnormal results are displayed) Labs Reviewed  RAPID STREP SCREEN ([facility] ONLY)  CULTURE, GROUP A STREP [facility])    EKG None  Radiology No results found.  Procedures Procedures (including critical care time)  Medications Ordered in UC Medications - No data to display  Initial Impression / Assessment and Plan / UC Course  I have reviewed the triage vital signs and the nursing notes.  Pertinent labs & imaging results that were available during my care of the patient were reviewed by me and considered in my medical decision making (see chart for details).     27 year old female presents with a viral upper respiratory infection.  Treating with ibuprofen and Atrovent nasal spray.  Supportive care.  Final Clinical Impressions(s) / UC Diagnoses   Final diagnoses:  Viral upper respiratory tract infection   Discharge Instructions   None    ED Prescriptions    Medication Sig Dispense Auth. Provider   ibuprofen (ADVIL,MOTRIN) 800 MG tablet Take 1 tablet (800 mg total) by mouth every 8 (eight) hours as needed. 30 tablet Selen Smucker G, DO   ipratropium (ATROVENT) 0.06 % nasal spray Place 2 sprays into both nostrils 4 (four) times daily as needed for rhinitis. 15 mL Tommie Sams, DO     Controlled Substance Prescriptions [place] Controlled Substance Registry consulted? Not Applicable   Tommie Sams, DO 11/17/17 1105

## 2017-11-17 NOTE — ED Triage Notes (Signed)
Patient c/o sore throat, cough and sore throat that started on Saturday.

## 2017-11-20 ENCOUNTER — Telehealth (HOSPITAL_COMMUNITY): Payer: Self-pay

## 2017-11-20 LAB — CULTURE, GROUP A STREP (THRC)

## 2017-11-20 NOTE — Telephone Encounter (Signed)
Attempted to reach patient to assess need for antibiotics based on symptoms. No answer at this time. Voicemail left.

## 2017-12-02 ENCOUNTER — Encounter: Payer: Self-pay | Admitting: Emergency Medicine

## 2017-12-02 ENCOUNTER — Ambulatory Visit
Admission: EM | Admit: 2017-12-02 | Discharge: 2017-12-02 | Disposition: A | Discharge status: Home / Self Care | Payer: BC Managed Care – PPO | Attending: Family Medicine | Admitting: Family Medicine

## 2017-12-02 ENCOUNTER — Other Ambulatory Visit: Payer: Self-pay

## 2017-12-02 ENCOUNTER — Ambulatory Visit (INDEPENDENT_AMBULATORY_CARE_PROVIDER_SITE_OTHER): Payer: Self-pay

## 2017-12-02 DIAGNOSIS — S39012A Strain of muscle, fascia and tendon of lower back, initial encounter: Secondary | ICD-10-CM

## 2017-12-02 LAB — PREGNANCY, URINE: Preg Test, Ur: NEGATIVE

## 2017-12-02 MED ORDER — IBUPROFEN 800 MG PO TABS: 800.0000 mg | ORAL_TABLET | Freq: Three times a day (TID) | ORAL | 0 refills | Status: DC | PRN

## 2017-12-02 MED ORDER — CYCLOBENZAPRINE HCL 10 MG PO TABS: 10.0000 mg | ORAL_TABLET | Freq: Two times a day (BID) | ORAL | 0 refills | Status: DC | PRN
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
## 2017-12-02 NOTE — Discharge Instructions (Addendum)
Take medication as prescribed. Rest. Drink plenty of fluids. Stretch.  ° °Follow up with your primary care physician this week as needed. Return to Urgent care for new or worsening concerns.  ° °
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
## 2018-07-05 ENCOUNTER — Other Ambulatory Visit: Payer: Self-pay | Admitting: Obstetrics and Gynecology

## 2018-07-05 DIAGNOSIS — N6310 Unspecified lump in the right breast, unspecified quadrant: Secondary | ICD-10-CM

## 2018-07-19 ENCOUNTER — Ambulatory Visit
Admission: RE | Admit: 2018-07-19 | Discharge: 2018-07-19 | Disposition: A | Discharge status: Home / Self Care | Payer: BC Managed Care – PPO | Source: Ambulatory Visit | Attending: Obstetrics and Gynecology | Admitting: Obstetrics and Gynecology

## 2018-07-19 DIAGNOSIS — N6310 Unspecified lump in the right breast, unspecified quadrant: Secondary | ICD-10-CM

## 2019-03-29 ENCOUNTER — Other Ambulatory Visit: Payer: Self-pay | Admitting: Obstetrics and Gynecology

## 2019-03-29 DIAGNOSIS — N6320 Unspecified lump in the left breast, unspecified quadrant: Secondary | ICD-10-CM

## 2019-04-08 ENCOUNTER — Other Ambulatory Visit: Payer: Self-pay

## 2019-04-08 ENCOUNTER — Ambulatory Visit
Admission: RE | Admit: 2019-04-08 | Discharge: 2019-04-08 | Disposition: A | Discharge status: Home / Self Care | Payer: BC Managed Care – PPO | Source: Ambulatory Visit | Attending: Obstetrics and Gynecology | Admitting: Obstetrics and Gynecology

## 2019-04-08 ENCOUNTER — Other Ambulatory Visit: Payer: Self-pay | Admitting: Obstetrics and Gynecology

## 2019-04-08 DIAGNOSIS — N6320 Unspecified lump in the left breast, unspecified quadrant: Secondary | ICD-10-CM

## 2019-04-12 ENCOUNTER — Ambulatory Visit
Admission: RE | Admit: 2019-04-12 | Discharge: 2019-04-12 | Disposition: A | Discharge status: Home / Self Care | Payer: BC Managed Care – PPO | Source: Ambulatory Visit | Attending: Obstetrics and Gynecology | Admitting: Obstetrics and Gynecology

## 2019-04-12 ENCOUNTER — Other Ambulatory Visit: Payer: Self-pay

## 2019-04-12 DIAGNOSIS — N6320 Unspecified lump in the left breast, unspecified quadrant: Secondary | ICD-10-CM

## 2019-11-24 ENCOUNTER — Encounter (HOSPITAL_COMMUNITY): Payer: Self-pay

## 2019-11-24 ENCOUNTER — Emergency Department (HOSPITAL_COMMUNITY)
Admission: EM | Admit: 2019-11-24 | Discharge: 2019-11-24 | Disposition: A | Discharge status: Home / Self Care | Payer: BC Managed Care – PPO | Attending: Emergency Medicine | Admitting: Emergency Medicine

## 2019-11-24 ENCOUNTER — Emergency Department (HOSPITAL_COMMUNITY): Payer: BC Managed Care – PPO

## 2019-11-24 ENCOUNTER — Other Ambulatory Visit: Payer: Self-pay

## 2019-11-24 DIAGNOSIS — Z5321 Procedure and treatment not carried out due to patient leaving prior to being seen by health care provider: Secondary | ICD-10-CM | POA: Diagnosis not present

## 2019-11-24 DIAGNOSIS — R0789 Other chest pain: Secondary | ICD-10-CM | POA: Diagnosis not present

## 2019-11-24 LAB — BASIC METABOLIC PANEL
Anion gap: 12 (ref 5–15)
BUN: 17 mg/dL (ref 6–20)
CO2: 21 mmol/L — ABNORMAL LOW (ref 22–32)
Calcium: 9.2 mg/dL (ref 8.9–10.3)
Chloride: 103 mmol/L (ref 98–111)
Creatinine, Ser: 0.92 mg/dL (ref 0.44–1.00)
GFR calc Af Amer: >60 mL/min (ref >60)
GFR calc non Af Amer: >60 mL/min (ref >60)
Glucose, Bld: 98 mg/dL (ref 70–99)
Potassium: 3.7 mmol/L (ref 3.5–5.1)
Sodium: 136 mmol/L (ref 135–145)

## 2019-11-24 LAB — CBC
HCT: 42.7 % (ref 36.0–46.0)
Hemoglobin: 13.9 g/dL (ref 12.0–15.0)
MCH: 27.3 pg (ref 26.0–34.0)
MCHC: 32.6 g/dL (ref 30.0–36.0)
MCV: 83.9 fL (ref 80.0–100.0)
Platelets: 275 10*3/uL (ref 150–400)
RBC: 5.09 MIL/uL (ref 3.87–5.11)
RDW: 13.3 % (ref 11.5–15.5)
WBC: 10.7 10*3/uL — ABNORMAL HIGH (ref 4.0–10.5)
nRBC: 0 % (ref 0.0–0.2)

## 2019-11-24 LAB — I-STAT BETA HCG BLOOD, ED (NOT ORDERABLE): I-stat hCG, quantitative: <5 m[IU]/mL (ref <5)

## 2019-11-24 LAB — TROPONIN I (HIGH SENSITIVITY): Troponin I (High Sensitivity): <2 ng/L (ref <18)

## 2019-11-24 NOTE — ED Triage Notes (Signed)
Left sided chest pain since 1000 this morning. Sts pain is worst and sharp on inspiration.

## 2021-05-19 IMAGING — US US BREAST*L* LIMITED INC AXILLA
1 series · 12 of 12 positions shown · non-contrast
Comparison: None.

CLINICAL DATA: 27-year-old female presenting for evaluation of pain
in the lower-inner quadrant of the left breast, and a palpable lump
in the periareolar left breast identified on clinical breast exam.

EXAM:
ULTRASOUND OF THE LEFT BREAST

[Series 1: us breast*left* limited inc axilla · 0.06mm/px · 12 of 12 slices shown]
[im 1/12]
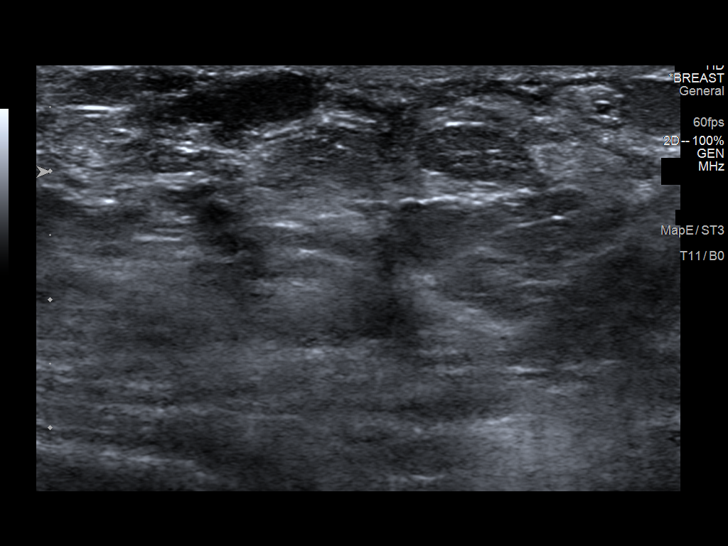
[im 2/12]
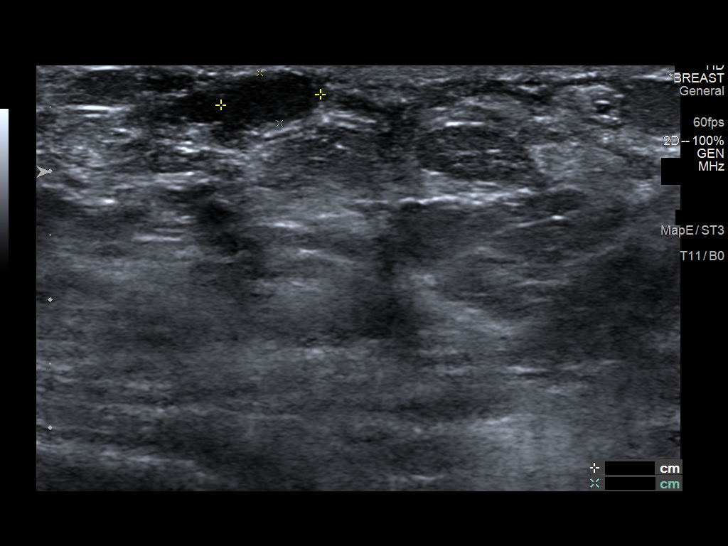
[im 3/12]
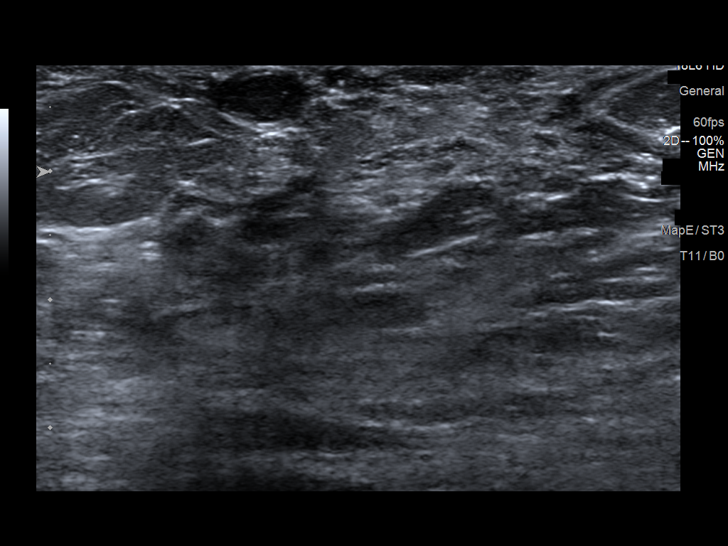
[im 4/12]
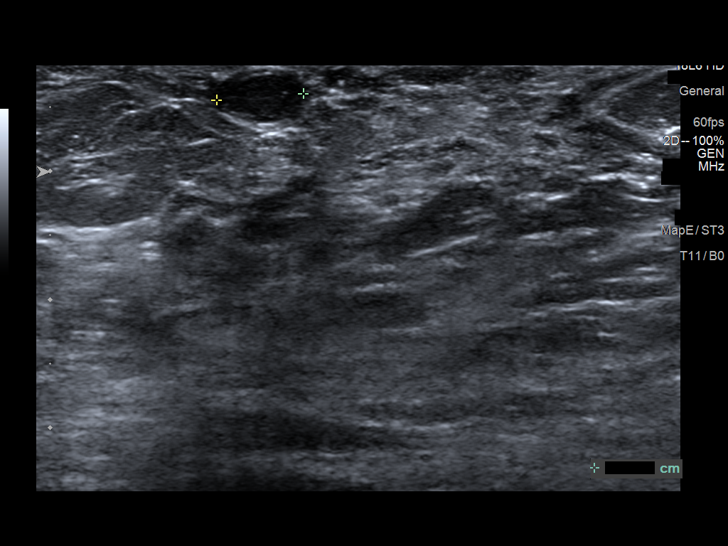
[im 5/12]
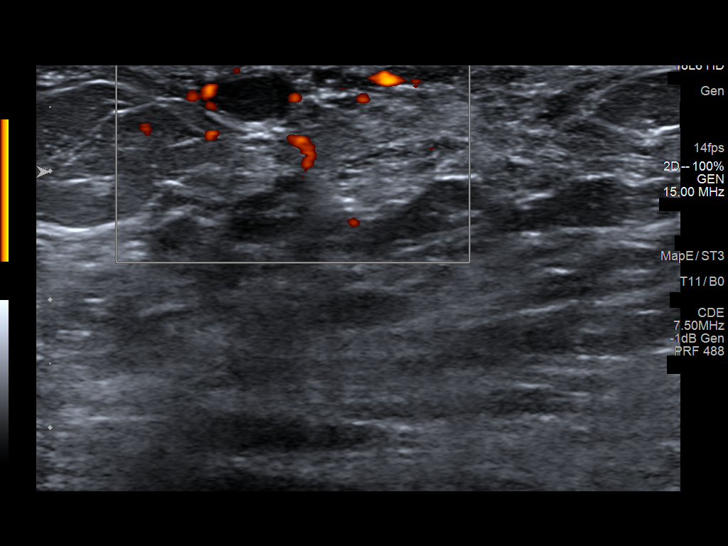
[im 6/12]
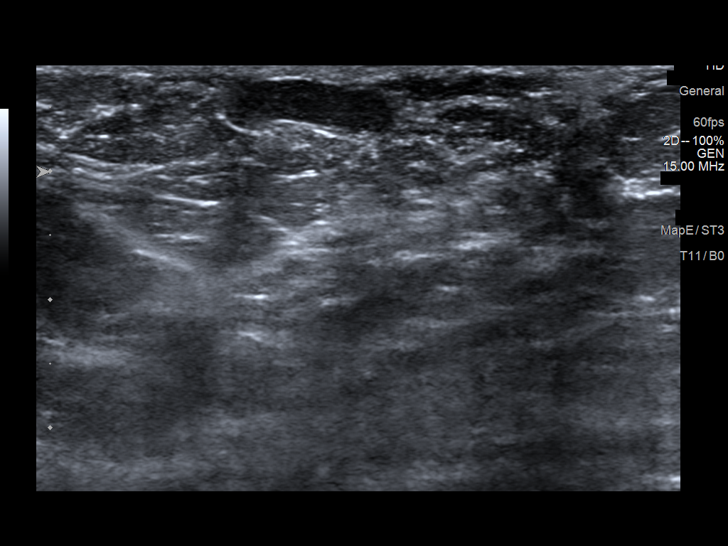
[im 7/12]
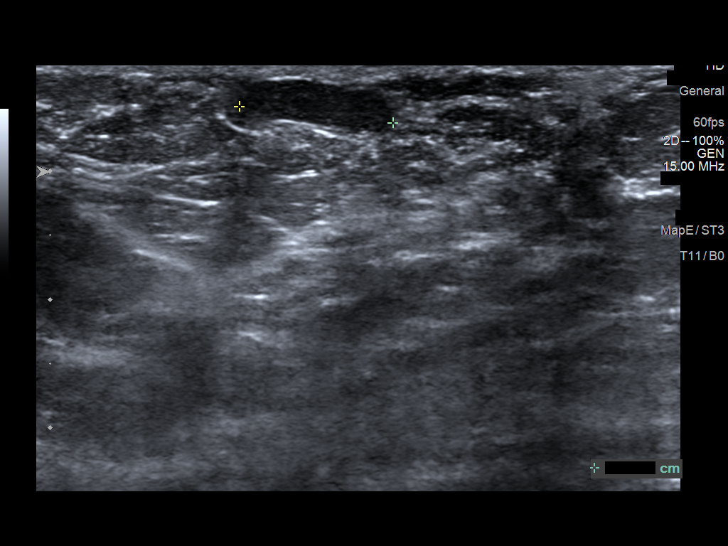
[im 8/12]
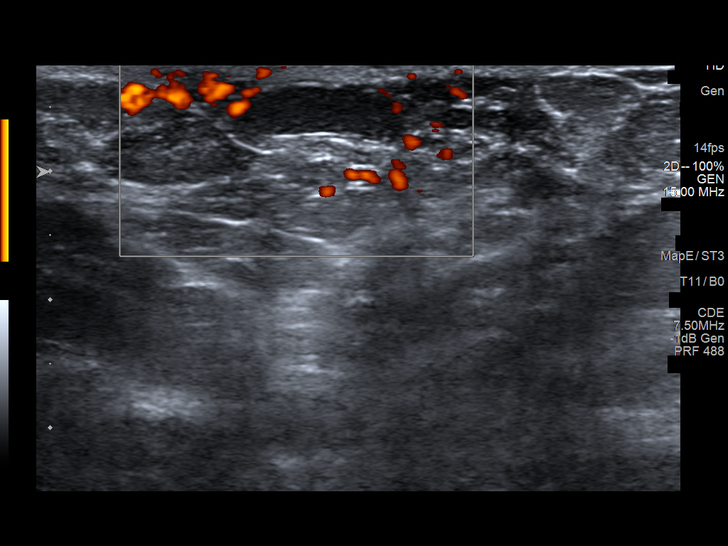
[im 9/12]
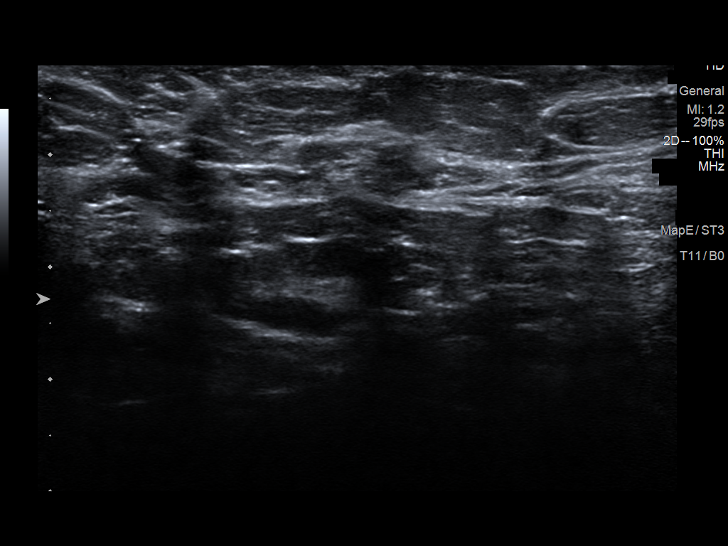
[im 10/12]
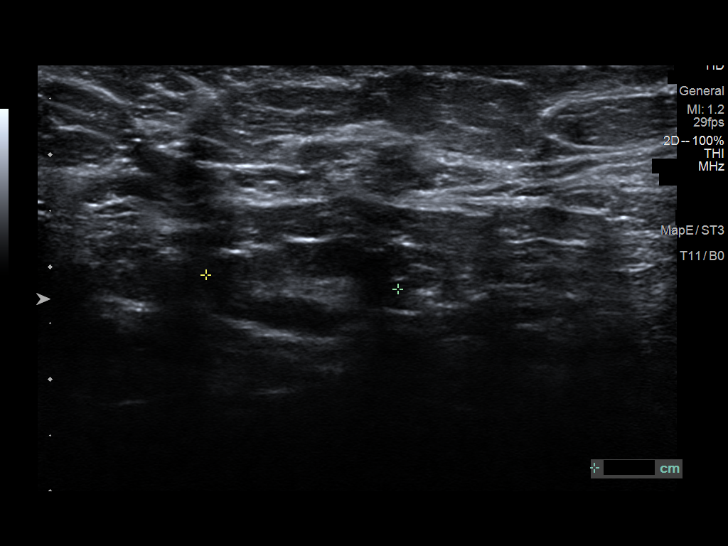
[im 11/12]
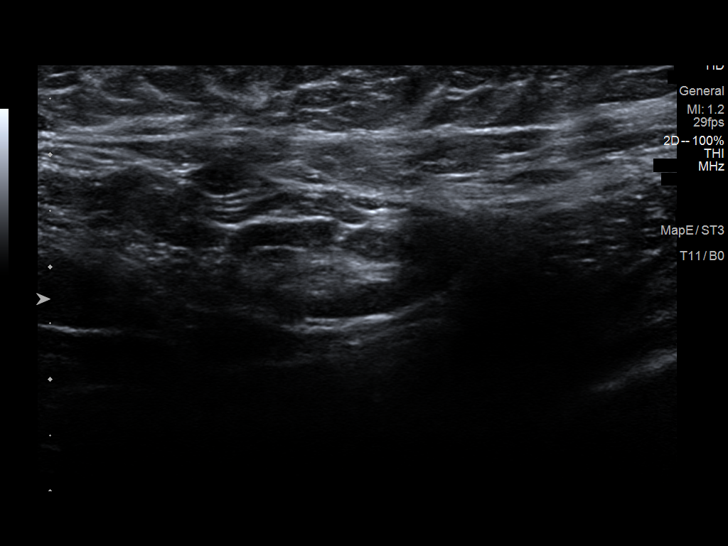
[im 12/12]
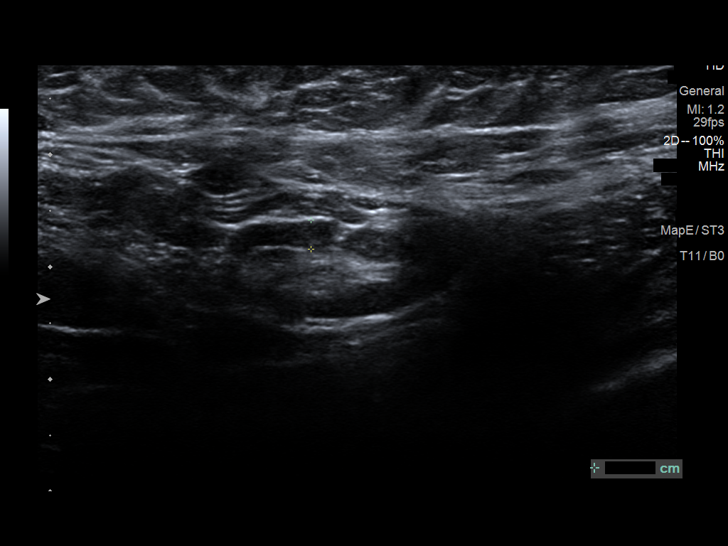

[12 of 12 positions shown; findings below may reference images not displayed]

FINDINGS: On physical exam, no discrete palpable masses are identified in the
lower-inner left breast. There is a firm ridge of tissue in the
lower-inner periareolar breast.

Targeted ultrasound is performed, showing an elongated hypoechoic
circumscribed mass at 8 o'clock, 1 cm from the nipple measuring
x 0.3 cm. No blood flow seen within the mass on color Doppler
imaging. No evidence of left axillary lymphadenopathy. No other
suspicious masses or areas of shadowing are identified in the
lower-inner left breast.
IMPRESSION: 1. There is an indeterminate left breast mass at 8 o'clock, which
may be intraductal.

2.  No evidence of left axillary lymphadenopathy.

RECOMMENDATION:
Ultrasound guided biopsy is recommended for the left breast mass at
8 o'clock. This has been scheduled for 04/12/2019 at [DATE] p.m.

I have discussed the findings and recommendations with the patient.
If applicable, a reminder letter will be sent to the patient
regarding the next appointment.

BI-RADS CATEGORY  4: Suspicious.

## 2021-05-22 LAB — HM PAP SMEAR
Chlamydia, Swab/Urine, PCR: NEGATIVE
HPV, high-risk: NEGATIVE

## 2021-05-23 IMAGING — US US BREAST BX W LOC DEV 1ST LESION IMG BX SPEC US GUIDE*L*
1 series · 8 of 8 positions shown · non-contrast
Comparison: Previous exam(s).
COMPARISON: Previous exam(s).
COMPARISON: Previous exam(s).

Addendum:
CLINICAL DATA: Patient with indeterminate left breast mass 8
o'clock position.

EXAM:
ULTRASOUND GUIDED LEFT BREAST CORE NEEDLE BIOPSY

[Series 1: us breast bx w loc dev 1st lesion img bx spec us g · 0.06mm/px · 8 of 8 slices shown]
[im 1/8]
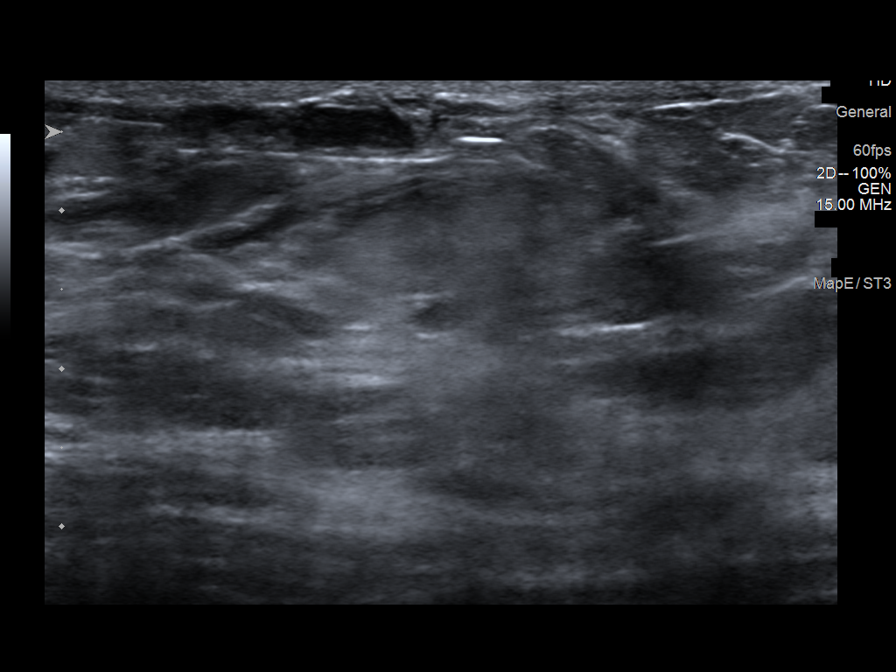
[im 2/8]
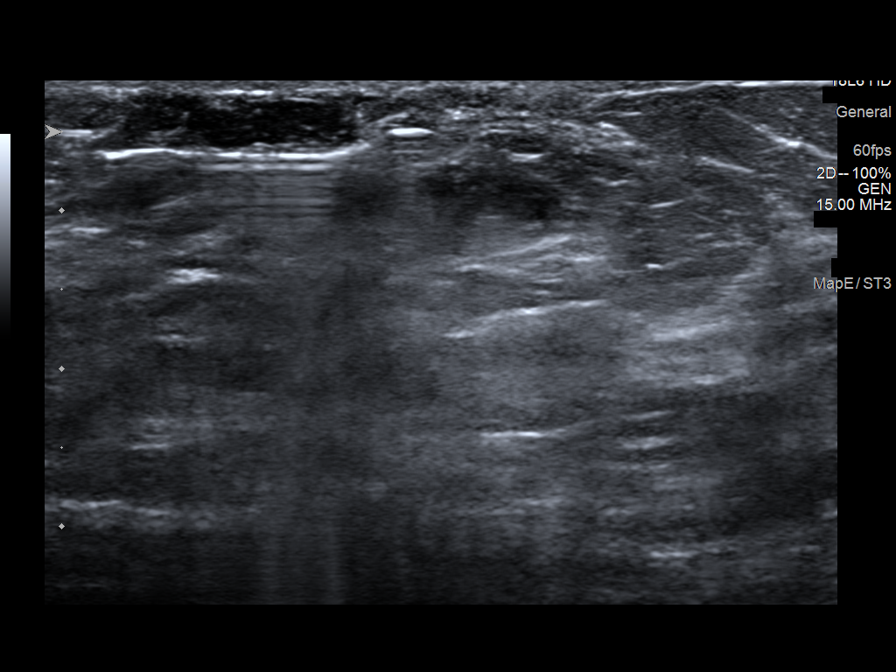
[im 3/8]
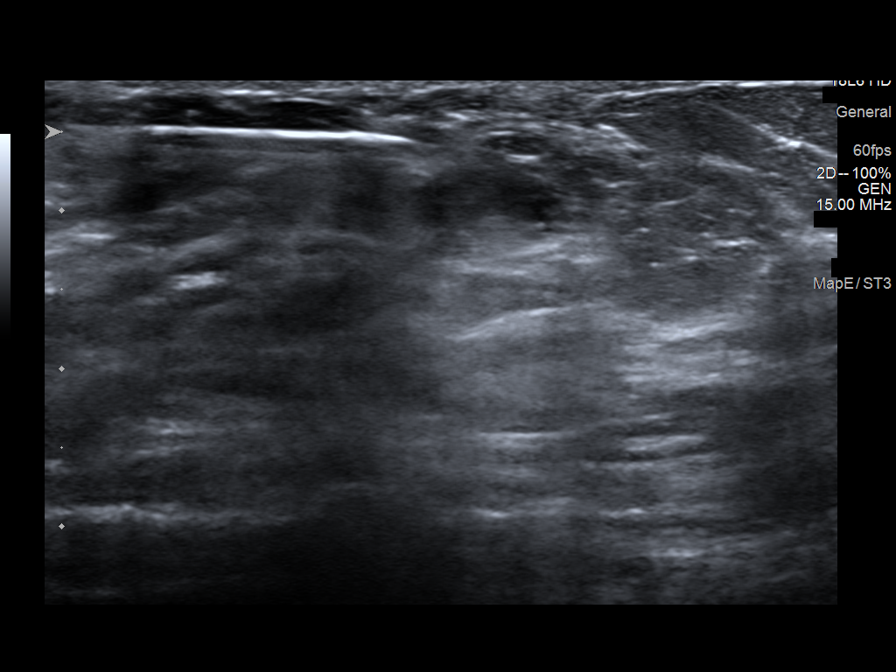
[im 4/8]
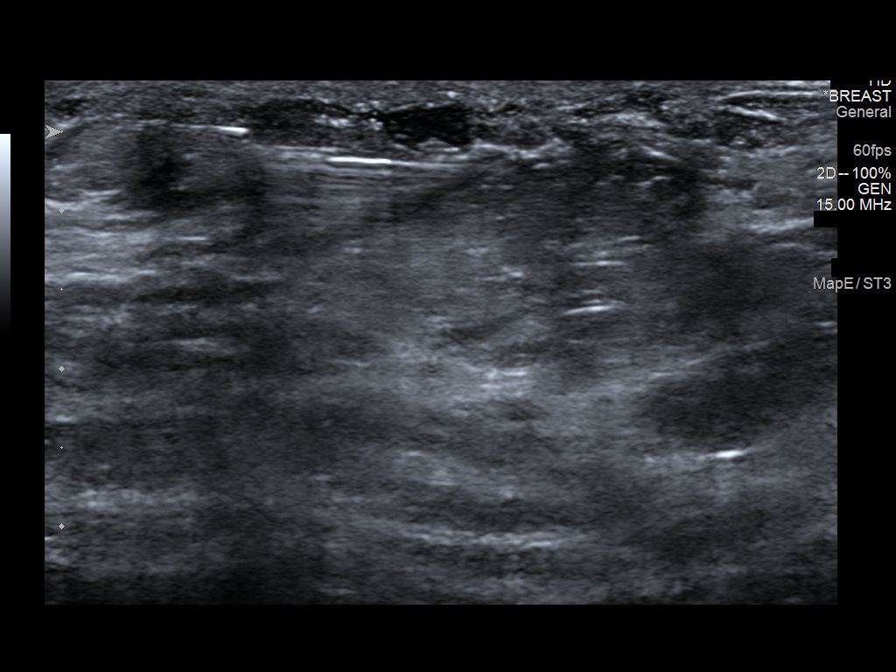
[im 5/8]
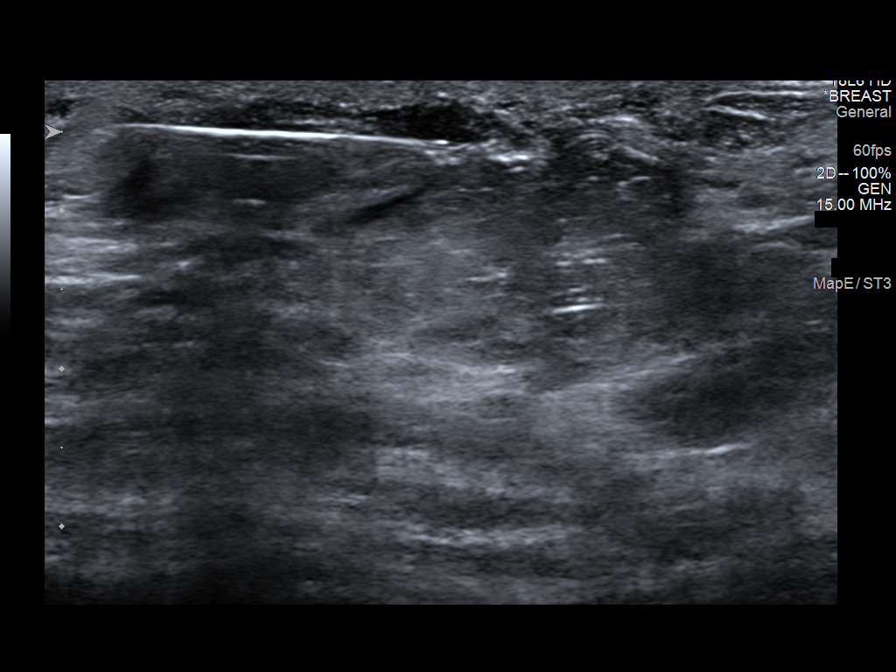
[im 6/8]
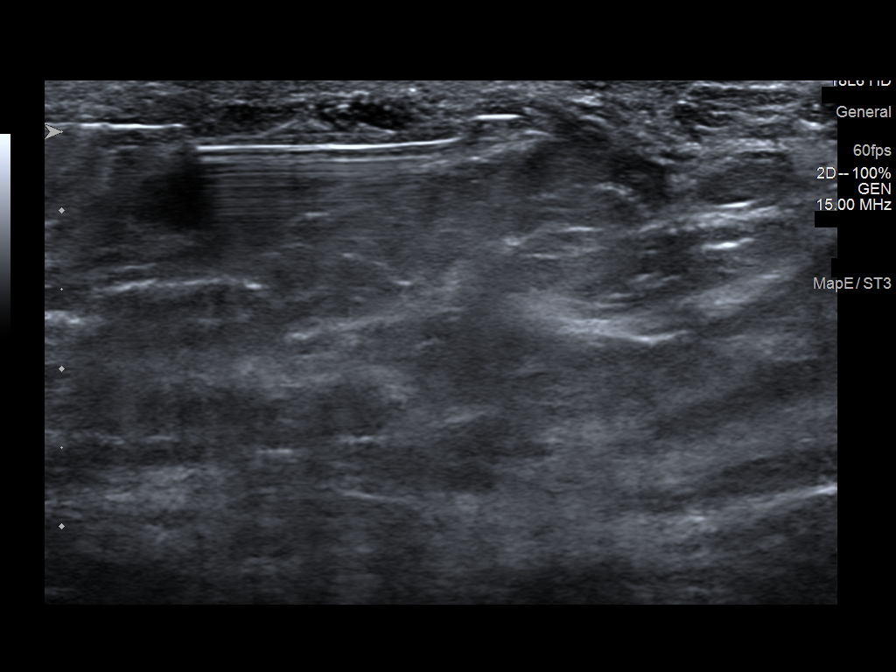
[im 7/8]
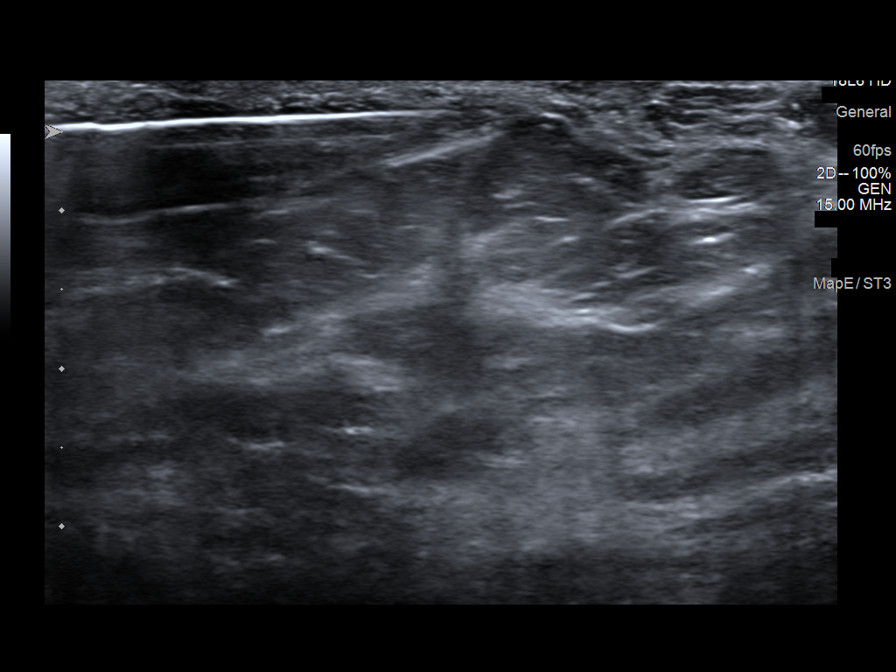
[im 8/8]
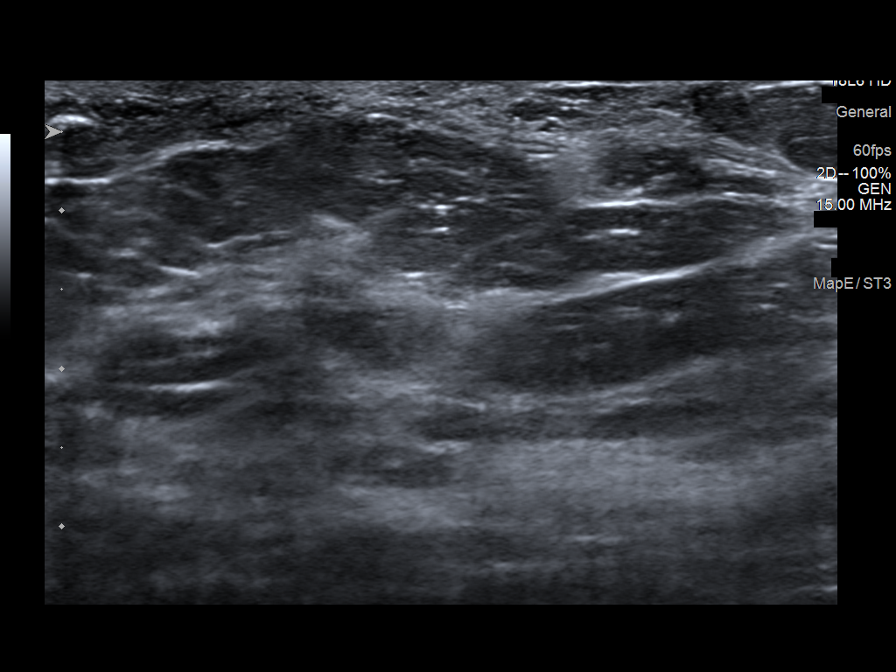

[8 of 8 positions shown; findings below may reference images not displayed]



Lesion quadrant: Lower inner quadrant

Using sterile technique and 1% Lidocaine as local anesthetic, under
direct ultrasound visualization, a 14 gauge Autoapziura device was
used to perform biopsy of left breast mass using a approach. At the
conclusion of the procedure HydroMARK tissue marker clip was
deployed into the biopsy cavity. Follow up 2 view mammogram was
performed and dictated separately.
IMPRESSION: Ultrasound guided biopsy of left breast mass 8 o'clock position. No
apparent complications.

ADDENDUM:
Pathology revealed FIBROCYSTIC CHANGES of the Left breast, 8
o'clock. This was found to be concordant by Dr. Zakiya Lutes.

Pathology results were discussed with the patient by telephone. The
patient reported doing well after the biopsy with tenderness at the
site. Post biopsy instructions and care were reviewed and questions
were answered. The patient was encouraged to call The [REDACTED]

The patient was instructed to continue with monthly self breast
examinations, clinical follow-up as needed, and to return for annual
mammography at 39.

Pathology results reported by Avea Igirio, RN on 04/13/2019.

ADDENDUM:
The patient returned to The [REDACTED] on April 20, 2019 for a
post-biopsy wound check of the LEFT breast. The wound was clean and
dry with scab formation. There was no redness, swelling or drainage
present. The patient was instructed to continue use of ice packs and
over the counter pain relievers as needed for discomfort. She was
instructed to call with any additional questions or concerns.

This information was called to [place], [place], at [facility], Dr. MONTXO PERLA on April 20, 2019.

Avea Igirio, RN on 04/21/2019.

*** End of Addendum ***
Addendum:
FINDINGS: I met with the patient and we discussed the procedure of
ultrasound-guided biopsy, including benefits and alternatives. We
discussed the high likelihood of a successful procedure. We
discussed the risks of the procedure, including infection, bleeding,
tissue injury, clip migration, and inadequate sampling. Informed
written consent was given. The usual time-out protocol was performed
immediately prior to the procedure.

Lesion quadrant: Lower inner quadrant

Using sterile technique and 1% Lidocaine as local anesthetic, under
direct ultrasound visualization, a 14 gauge Autoapziura device was
used to perform biopsy of left breast mass using a approach. At the
conclusion of the procedure HydroMARK tissue marker clip was
deployed into the biopsy cavity. Follow up 2 view mammogram was
performed and dictated separately.
IMPRESSION: Ultrasound guided biopsy of left breast mass 8 o'clock position. No
apparent complications.

ADDENDUM:
Pathology revealed FIBROCYSTIC CHANGES of the Left breast, 8
o'clock. This was found to be concordant by Dr. Zakiya Lutes.

Pathology results were discussed with the patient by telephone. The
patient reported doing well after the biopsy with tenderness at the
site. Post biopsy instructions and care were reviewed and questions
were answered. The patient was encouraged to call The [REDACTED]

The patient was instructed to continue with monthly self breast
examinations, clinical follow-up as needed, and to return for annual
mammography at 39.

Pathology results reported by Avea Igirio, RN on 04/13/2019.



Lesion quadrant: Lower inner quadrant

Using sterile technique and 1% Lidocaine as local anesthetic, under
direct ultrasound visualization, a 14 gauge Autoapziura device was
used to perform biopsy of left breast mass using a approach. At the
conclusion of the procedure HydroMARK tissue marker clip was
deployed into the biopsy cavity. Follow up 2 view mammogram was
performed and dictated separately.
IMPRESSION: Ultrasound guided biopsy of left breast mass 8 o'clock position. No
apparent complications.

## 2021-08-19 ENCOUNTER — Other Ambulatory Visit: Payer: Self-pay

## 2021-08-19 ENCOUNTER — Encounter (HOSPITAL_BASED_OUTPATIENT_CLINIC_OR_DEPARTMENT_OTHER): Payer: Self-pay | Admitting: Nurse Practitioner

## 2021-08-19 ENCOUNTER — Ambulatory Visit (INDEPENDENT_AMBULATORY_CARE_PROVIDER_SITE_OTHER): Payer: BC Managed Care – PPO | Admitting: Nurse Practitioner

## 2021-08-19 VITALS — BP 117/72 | HR 82 | Resp 18 | Ht 63.0 in | Wt 235.0 lb

## 2021-08-19 DIAGNOSIS — R110 Nausea: Secondary | ICD-10-CM | POA: Insufficient documentation

## 2021-08-19 DIAGNOSIS — Z1329 Encounter for screening for other suspected endocrine disorder: Secondary | ICD-10-CM

## 2021-08-19 DIAGNOSIS — Z0000 Encounter for general adult medical examination without abnormal findings: Secondary | ICD-10-CM | POA: Diagnosis not present

## 2021-08-19 DIAGNOSIS — Z1321 Encounter for screening for nutritional disorder: Secondary | ICD-10-CM

## 2021-08-19 DIAGNOSIS — Z13228 Encounter for screening for other metabolic disorders: Secondary | ICD-10-CM

## 2021-08-19 DIAGNOSIS — Z130 Encounter for screening for diseases of the blood and blood-forming organs and certain disorders involving the immune mechanism: Secondary | ICD-10-CM

## 2021-08-19 HISTORY — DX: Nausea: R11.0

## 2021-08-19 MED ORDER — ONDANSETRON HCL 8 MG PO TABS: 8.0000 mg | ORAL_TABLET | Freq: Three times a day (TID) | ORAL | 2 refills | Status: DC | PRN

## 2021-08-19 NOTE — Progress Notes (Signed)
Orma Render, DNP, AGNP-c [facility] [address]   [place], [place] [address] [phone]  New patient visit   Patient: Michele Pennington   DOB: [birth year]   31 y.o. Female  MRN: [id number] Visit Date: 08/19/2021  Patient Care Team: Early, Coralee Pesa, NP as PCP - General (Nurse Practitioner)  Today's healthcare provider: Orma Render, NP   Chief Complaint  Patient presents with   Michele Pennington is a 31 y.o. female who presents today as a new patient to establish care.    Patient endorses the following concerns presently: Intermittent nausea with menstruation. She also reports moderate cramping during this period without the presence of heavy bleeding. She tells me that the worst symptom is the nausea that seems to last through about a day. 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   She lives at home with her 51 year old son. She recently moved her mother in to help her with raising him. She reports that she lives in a safe environment with no concerns for her safety. She denies use of alcohol, recreational drugs, or nicotine products. She is not currently sexually active.   She has a history of hypertension during pregnancy. She has not had any issues with this in recent years and is not on any medication.   History reviewed and reveals the following: Past Medical History:  Diagnosis Date   Chlamydia    Hypertension    Past Surgical History:  Procedure Laterality Date   BREAST SURGERY     2 biopsies   CESAREAN SECTION N/A 05/26/2016   Procedure: CESAREAN SECTION;  Surgeon: Waymon Amato, MD;  Location: [facility];  Service: Obstetrics;  Laterality: N/A;   NO PAST SURGERIES     Family Status  Relation Name Status   Mother Michele Pennington Alive   Mat Aunt Michele Pennington (Not Specified)   MGM Michele Pennington (Not Specified)   Father  (Not Specified)    MGF Michele Pennington Alive   Family History  Problem Relation Age of Onset   Diabetes Mother    Hypertension Mother    Cancer Mother        ovarian   Obesity Mother    Diabetes Maternal Aunt    Obesity Maternal Aunt    Diabetes Maternal Grandmother    Obesity Maternal Grandmother    Other Father        unknown medical history   Heart failure Maternal Grandfather    Heart disease Maternal Grandfather    Social History   Socioeconomic History   Marital status: Single    Spouse name: Not on file   Number of children: Not on file   Years of education: Not on file   Highest education level: Not on file  Occupational History   Not on file  Tobacco Use   Smoking status: Never   Smokeless tobacco: Never  Vaping Use   Vaping Use: Never used  Substance and Sexual Activity   Alcohol use: No   Drug use: Never   Sexual activity: Yes    Birth control/protection: I.U.D.  Other Topics Concern   Not on file  Social History Narrative   Not on file   Social Determinants of Health   Financial Resource Strain: Not on file  Food Insecurity: Not on file  Transportation Needs: Not on file  Physical  Activity: Not on file  Stress: Not on file  Social Connections: Not on file   Outpatient Medications Prior to Visit  Medication Sig   levonorgestrel (MIRENA, 52 MG,) 20 MCG/DAY IUD    [DISCONTINUED] cyclobenzaprine (FLEXERIL) 10 MG tablet Take 1 tablet (10 mg total) by mouth 2 (two) times daily as needed for muscle spasms. Do not drive while taking as can cause drowsiness   [DISCONTINUED] ibuprofen (ADVIL,MOTRIN) 800 MG tablet Take 1 tablet (800 mg total) by mouth every 8 (eight) hours as needed for mild pain or moderate pain.   [DISCONTINUED] levonorgestrel (MIRENA) 20 MCG/24HR IUD 1 each by Intrauterine route once.   No facility-administered medications prior to visit.   No Known Allergies Immunization History  Administered Date(s) Administered   DTaP 08/27/1990, 10/27/1990, 12/28/1990,  10/24/1991, 10/16/1995   HPV Quadrivalent 08/21/2008   Hepatitis A 08/12/2005   Hepatitis A, Ped/Adol-2 Dose 08/21/2008   Hepatitis B 04/05/2004, 08/12/2005   Hepatitis B, ped/adol 08/21/2008   HiB (PRP-OMP) 08/27/1990, 10/27/1990, 12/28/1990, 10/24/1991   Influenza, Seasonal, Injecte, Preservative Fre 08/21/2008   Influenza,inj,Quad PF,6+ Mos 03/28/2016, 07/05/2018   Influenza-Unspecified 03/02/2015, 02/28/2021   MMR 10/24/1991, 10/16/1995   Meningococcal Conjugate 08/12/2005   OPV 08/27/1990, 10/27/1990, 10/24/1991, 10/16/1995   Tdap 08/12/2005, 03/28/2016   Varicella 10/31/1994, 08/12/2005    Health Maintenance Due: Health Maintenance  Topic Date Due   COVID-19 Vaccine (1) Never done   PAP SMEAR-Modifier  07/02/2020   TETANUS/TDAP  03/28/2026   INFLUENZA VACCINE  Completed   HIV Screening  Completed   HPV VACCINES  Aged Out   Hepatitis C Screening  Discontinued    Review of Systems All review of systems negative except what is listed in the HPI   Objective    BP 117/72    Pulse 82    Resp 18    Ht 5' 3" (1.6 m)    Wt 235 lb (106.6 kg)    LMP  (LMP Unknown)    SpO2 97%    Breastfeeding No    BMI 41.63 kg/m  Physical Exam Vitals and nursing note reviewed.  Constitutional:      General: She is not in acute distress.    Appearance: Normal appearance.  HENT:     Head: Normocephalic and atraumatic.     Right Ear: Tympanic membrane normal.     Left Ear: Tympanic membrane normal.     Nose: Nose normal.     Mouth/Throat:     Mouth: Mucous membranes are moist.     Pharynx: Oropharynx is clear.  Eyes:     Extraocular Movements: Extraocular movements intact.     Conjunctiva/sclera: Conjunctivae normal.     Pupils: Pupils are equal, round, and reactive to light.  Neck:     Vascular: No carotid bruit.  Cardiovascular:     Rate and Rhythm: Normal rate and regular rhythm.     Pulses: Normal pulses.     Heart sounds: Normal heart sounds. No murmur heard. Pulmonary:      Effort: Pulmonary effort is normal.     Breath sounds: Normal breath sounds. No wheezing.  Abdominal:     General: Bowel sounds are normal. There is no distension.     Palpations: Abdomen is soft.     Tenderness: There is no abdominal tenderness. There is no guarding or rebound.  Musculoskeletal:        General: Normal range of motion.     Cervical back: Normal range of motion.  Right lower leg: No edema.     Left lower leg: No edema.  Skin:    General: Skin is warm and dry.     Capillary Refill: Capillary refill takes less than 2 seconds.  Neurological:     General: No focal deficit present.     Mental Status: She is alert and oriented to person, place, and time.  Psychiatric:        Mood and Affect: Mood normal.        Behavior: Behavior normal.        Thought Content: Thought content normal.        Judgment: Judgment normal.    Results for orders placed or performed in visit on 08/19/21  CBC with Differential/Platelet  Result Value Ref Range   WBC 8.0 3.4 - 10.8 x10E3/uL   RBC 5.09 3.77 - 5.28 x10E6/uL   Hemoglobin 13.5 11.1 - 15.9 g/dL   Hematocrit 41.3 34.0 - 46.6 %   MCV 81 79 - 97 fL   MCH 26.5 (L) 26.6 - 33.0 pg   MCHC 32.7 31.5 - 35.7 g/dL   RDW 13.1 11.7 - 15.4 %   Platelets 329 150 - 450 x10E3/uL   Neutrophils 46 Not Estab. %   Lymphs 38 Not Estab. %   Monocytes 11 Not Estab. %   Eos 4 Not Estab. %   Basos 1 Not Estab. %   Neutrophils Absolute 3.8 1.4 - 7.0 x10E3/uL   Lymphocytes Absolute 3.0 0.7 - 3.1 x10E3/uL   Monocytes Absolute 0.9 0.1 - 0.9 x10E3/uL   EOS (ABSOLUTE) 0.3 0.0 - 0.4 x10E3/uL   Basophils Absolute 0.1 0.0 - 0.2 x10E3/uL   Immature Granulocytes 0 Not Estab. %   Immature Grans (Abs) 0.0 0.0 - 0.1 x10E3/uL  Comprehensive metabolic panel  Result Value Ref Range   Glucose 83 70 - 99 mg/dL   BUN 13 6 - 20 mg/dL   Creatinine, Ser 1.07 (H) 0.57 - 1.00 mg/dL   eGFR 71 >59 mL/min/1.73   BUN/Creatinine Ratio 12 9 - 23   Sodium 140 134 - 144  mmol/L   Potassium 4.8 3.5 - 5.2 mmol/L   Chloride 103 96 - 106 mmol/L   CO2 20 20 - 29 mmol/L   Calcium 9.8 8.7 - 10.2 mg/dL   Total Protein 7.5 6.0 - 8.5 g/dL   Albumin 4.4 3.8 - 4.8 g/dL   Globulin, Total 3.1 1.5 - 4.5 g/dL   Albumin/Globulin Ratio 1.4 1.2 - 2.2   Bilirubin Total <0.2 0.0 - 1.2 mg/dL   Alkaline Phosphatase 46 44 - 121 IU/L   AST 12 0 - 40 IU/L   ALT 13 0 - 32 IU/L  Hemoglobin A1c  Result Value Ref Range   Hgb A1c MFr Bld 5.8 (H) 4.8 - 5.6 %   Est. average glucose Bld gHb Est-mCnc 120 mg/dL  TSH  Result Value Ref Range   TSH 1.520 0.450 - 4.500 uIU/mL  VITAMIN D 25 Hydroxy (Vit-D Deficiency, Fractures)  Result Value Ref Range   Vit D, 25-Hydroxy 26.3 (L) 30.0 - 100.0 ng/mL    Assessment & Plan      Problem List Items Addressed This Visit     Encounter for medical examination to establish care - Primary    Review of current and past medical history, social history, medication, and family history.  Review of care gaps and health maintenance recommendations.  Records from recent providers to be requested if not available in Chart Review  or Care Everywhere.  Recommendations for health maintenance, diet, and exercise provided.         Nausea    Intermittent nausea associated with menstruation. Will send prn zofran for severe symptoms.       Relevant Medications   ondansetron (ZOFRAN) 8 MG tablet   Other Visit Diagnoses     Screening for endocrine, nutritional, metabolic and immunity disorder       Relevant Orders   CBC with Differential/Platelet (Completed)   Comprehensive metabolic panel (Completed)   Hemoglobin A1c (Completed)   TSH (Completed)   VITAMIN D 25 Hydroxy (Vit-D Deficiency, Fractures) (Completed)        Return in about 1 year (around 08/19/2022) for CPE today- CPE in 1 year.      Franny Selvage, Coralee Pesa, NP, DNP, AGNP-C [facility] at [place]

## 2021-08-19 NOTE — Patient Instructions (Signed)
Thank you for choosing [place] [facility] for your Primary Care needs. I am excited for the opportunity to partner with you to meet your health care goals. It was a pleasure meeting you today!  Recommendations from today's visit: We will get some labs today to see how you are doing overall. I will let you know if there are any concerns.  If your cramping gets worse, please let me know and we can look into this more.   Information on diet, exercise, and health maintenance recommendations are listed below. This is information to help you be sure you are on track for optimal health and monitoring.   Please look over this and let us know if you have any questions or if you have completed any of the health maintenance outside of [facility] so that we can be sure your records are up to date.  ___________________________________________________________ About Me: I am an Adult-Geriatric Nurse Practitioner with a background in caring for patients for more than 20 years with a strong intensive care background. I provide primary care and sports medicine services to patients age 32 and older within this office. My education had a strong focus on caring for the older adult population, which I am passionate about. I am also the director of the APP Fellowship with [facility].   My desire is to provide you with the best service through preventive medicine and supportive care. I consider you a part of the medical team and value your input. I work diligently to ensure that you are heard and your needs are met in a safe and effective manner. I want you to feel comfortable with me as your provider and want you to know that your health concerns are important to me.  For your information, our office hours are: Monday, Tuesday, and Thursday 8:00 AM - 5:00 PM Wednesday and Friday 8:00 AM - 12:00 PM.   In my time away from the office I am teaching new APP's within the system and am  unavailable, but my partner, Dr. Burnard Bunting is in the office for emergent needs.   If you have questions or concerns, please call our office at [phone] or send [place] a MyChart message and we will respond as quickly as possible.  ____________________________________________________________ MyChart:  For all urgent or time sensitive needs we ask that you please call the office to avoid delays. Our number is [phone]. MyChart is not constantly monitored and due to the large volume of messages a day, replies may take up to 72 business hours.  MyChart Policy: MyChart allows for you to see your visit notes, after visit summary, provider recommendations, lab and tests results, make an appointment, request refills, and contact your provider or the office for non-urgent questions or concerns. Providers are seeing patients during normal business hours and do not have built in time to review MyChart messages.  We ask that you allow a minimum of 3 business days for responses to [employer]. For this reason, please do not send urgent requests through [facility]. Please call the office at [phone]. New and ongoing conditions may require a visit. We have virtual and in person visit available for your convenience.  Complex MyChart concerns may require a visit. Your provider may request you schedule a virtual or in person visit to ensure we are providing the best care possible. MyChart messages sent after 11:00 AM on Friday will not be received by the provider until Monday morning.  Lab and Test Results: °You will receive your lab and test results on MyChart as soon as they are completed and results have been sent by the lab or testing facility. Due to this service, you will receive your results BEFORE your provider.  °I review lab and tests results each morning prior to seeing patients. Some results require collaboration with other providers to ensure you are receiving the most appropriate care. For this  reason, we ask that you please allow a minimum of 3-5 business days from the time the ALL results have been received for your provider to receive and review lab and test results and contact you about these.  °Most lab and test result comments from the provider will be sent through MyChart. Your provider may recommend changes to the plan of care, follow-up visits, repeat testing, ask questions, or request an office visit to discuss these results. You may reply directly to this message or call the office at [phone] to provide information for the provider or set up an appointment. °In some instances, you will be called with test results and recommendations. Please let us know if this is preferred and we will make note of this in your chart to provide this for you.    °If you have not heard a response to your lab or test results in 5 business days from all results returning to MyChart, please call the office to let us know. We ask that you please avoid calling prior to this time unless there is an emergent concern. Due to high call volumes, this can delay the resulting process. ° °After Hours: °For all non-emergency after hours needs, please call the office at [phone] and select the option to reach the on-call provider service. On-call services are shared between multiple [facility] offices and therefore it will not be possible to speak directly with your provider. On-call providers may provide medical advice and recommendations, but are unable to provide refills for maintenance medications.  °For all emergency or urgent medical needs after normal business hours, we recommend that you seek care at the closest Urgent Care or Emergency Department to ensure appropriate treatment in a timely manner.  °[facility] has a 24 hour emergency room located on the ground floor for your convenience.  ° °Urgent Concerns During the Business Day °Providers are seeing patients from 8AM to 5PM with a  busy schedule and are most often not able to respond to non-urgent calls until the end of the day or the next business day. °If you should have URGENT concerns during the day, please call and speak to the nurse or schedule a same day appointment so that we can address your concern without delay.  ° °Thank you, again, for choosing me as your health care partner. I appreciate your trust and look forward to learning more about you.  ° °SaraBeth Krystian Ferrentino, DNP, AGNP-c °___________________________________________________________ ° °Health Maintenance Recommendations °Screening Testing °Mammogram °Every 1 -2 years based on history and risk factors °Starting at age 40 °Pap Smear °Ages 21-39 every 3 years °Ages 30-65 every 5 years with HPV testing °More frequent testing may be required based on results and history °Colon Cancer Screening °Every 1-10 years based on test performed, risk factors, and history °Starting at age 45 °Bone Density Screening °Every 2-10 years based on history °Starting at age 65 for women °Recommendations for men differ based on medication usage, history, and risk factors °AAA Screening °One time ultrasound °Men 65-75 years old who have   every smoked Lung Cancer Screening Low Dose Lung CT every 12 months Age 9-80 years with a 30 pack-year smoking history who still smoke or who have quit within the last 15 years  Screening Labs Routine  Labs: Complete Blood Count (CBC), Complete Metabolic Panel (CMP), Cholesterol (Lipid Panel) Every 6-12 months based on history and medications May be recommended more frequently based on current conditions or previous results Hemoglobin A1c Lab Every 3-12 months based on history and previous results Starting at age 89 or earlier with diagnosis of diabetes, high cholesterol, BMI >26, and/or risk factors Frequent monitoring for patients with diabetes to ensure blood sugar control Thyroid Panel (TSH w/ T3 & T4) Every 6 months based on history, symptoms, and risk  factors May be repeated more often if on medication HIV One time testing for all patients 21 and older May be repeated more frequently for patients with increased risk factors or exposure Hepatitis C One time testing for all patients 32 and older May be repeated more frequently for patients with increased risk factors or exposure Gonorrhea, Chlamydia Every 12 months for all sexually active persons 13-24 years Additional monitoring may be recommended for those who are considered high risk or who have symptoms PSA Men 70-76 years old with risk factors Additional screening may be recommended from age 83-69 based on risk factors, symptoms, and history  Vaccine Recommendations Tetanus Booster All adults every 10 years Flu Vaccine All patients 6 months and older every year COVID Vaccine All patients 12 years and older Initial dosing with booster May recommend additional booster based on age and health history HPV Vaccine 2 doses all patients age 84-26 Dosing may be considered for patients over 26 Shingles Vaccine (Shingrix) 2 doses all adults 78 years and older Pneumonia (Pneumovax 23) All adults 50 years and older May recommend earlier dosing based on health history Pneumonia (Prevnar 37) All adults 70 years and older Dosed 1 year after Pneumovax 23  Additional Screening, Testing, and Vaccinations may be recommended on an individualized basis based on family history, health history, risk factors, and/or exposure.  __________________________________________________________  Diet Recommendations for All Patients  I recommend that all patients maintain a diet low in saturated fats, carbohydrates, and cholesterol. While this can be challenging at first, it is not impossible and small changes can make big differences.  Things to try: Decreasing the amount of soda, sweet tea, and/or juice to one or less per day and replace with water While water is always the first choice, if you do  not like water you may consider adding a water additive without sugar to improve the taste other sugar free drinks Replace potatoes with a brightly colored vegetable at dinner Use healthy oils, such as canola oil or olive oil, instead of butter or hard margarine Limit your bread intake to two pieces or less a day Replace regular pasta with low carb pasta options Bake, broil, or grill foods instead of frying Monitor portion sizes  Eat smaller, more frequent meals throughout the day instead of large meals  An important thing to remember is, if you love foods that are not great for your health, you don't have to give them up completely. Instead, allow these foods to be a reward when you have done well. Allowing yourself to still have special treats every once in a while is a nice way to tell yourself thank you for working hard to keep yourself healthy.   Also remember that every day is a new day.  If you have a bad day and "fall off the wagon", you can still climb right back up and keep moving along on your journey!  We have resources available to help you!  Some websites that may be helpful include: www.http://carter.biz/  Www.VeryWellFit.com _____________________________________________________________  Activity Recommendations for All Patients  I recommend that all adults get at least 20 minutes of moderate physical activity that elevates your heart rate at least 5 days out of the week.  Some examples include: Walking or jogging at a pace that allows you to carry on a conversation Cycling (stationary bike or outdoors) Water aerobics Yoga Weight lifting Dancing If physical limitations prevent you from putting stress on your joints, exercise in a pool or seated in a chair are excellent options.  Do determine your MAXIMUM heart rate for activity: YOUR AGE - 220 = MAX HeartRate   Remember! Do not push yourself too hard.  Start slowly and build up your pace, speed, weight, time in exercise,  etc.  Allow your body to rest between exercise and get good sleep. You will need more water than normal when you are exerting yourself. Do not wait until you are thirsty to drink. Drink with a purpose of getting in at least 8, 8 ounce glasses of water a day plus more depending on how much you exercise and sweat.    If you begin to develop dizziness, chest pain, abdominal pain, jaw pain, shortness of breath, headache, vision changes, lightheadedness, or other concerning symptoms, stop the activity and allow your body to rest. If your symptoms are severe, seek emergency evaluation immediately. If your symptoms are concerning, but not severe, please let us know so that we can recommend further evaluation.

## 2021-08-20 ENCOUNTER — Encounter (HOSPITAL_BASED_OUTPATIENT_CLINIC_OR_DEPARTMENT_OTHER): Payer: Self-pay | Admitting: Nurse Practitioner

## 2021-08-20 LAB — CBC WITH DIFFERENTIAL/PLATELET
Basophils Absolute: 0.1 10*3/uL (ref 0.0–0.2)
Basos: 1 %
EOS (ABSOLUTE): 0.3 10*3/uL (ref 0.0–0.4)
Eos: 4 %
Hematocrit: 41.3 % (ref 34.0–46.6)
Hemoglobin: 13.5 g/dL (ref 11.1–15.9)
Immature Grans (Abs): 0 10*3/uL (ref 0.0–0.1)
Immature Granulocytes: 0 %
Lymphocytes Absolute: 3 10*3/uL (ref 0.7–3.1)
Lymphs: 38 %
MCH: 26.5 pg — ABNORMAL LOW (ref 26.6–33.0)
MCHC: 32.7 g/dL (ref 31.5–35.7)
MCV: 81 fL (ref 79–97)
Monocytes Absolute: 0.9 10*3/uL (ref 0.1–0.9)
Monocytes: 11 %
Neutrophils Absolute: 3.8 10*3/uL (ref 1.4–7.0)
Neutrophils: 46 %
Platelets: 329 10*3/uL (ref 150–450)
RBC: 5.09 x10E6/uL (ref 3.77–5.28)
RDW: 13.1 % (ref 11.7–15.4)
WBC: 8 10*3/uL (ref 3.4–10.8)

## 2021-08-20 LAB — COMPREHENSIVE METABOLIC PANEL
ALT: 13 IU/L (ref 0–32)
AST: 12 IU/L (ref 0–40)
Albumin/Globulin Ratio: 1.4 (ref 1.2–2.2)
Albumin: 4.4 g/dL (ref 3.8–4.8)
Alkaline Phosphatase: 46 IU/L (ref 44–121)
BUN/Creatinine Ratio: 12 (ref 9–23)
BUN: 13 mg/dL (ref 6–20)
Bilirubin Total: <0.2 mg/dL (ref 0.0–1.2)
CO2: 20 mmol/L (ref 20–29)
Calcium: 9.8 mg/dL (ref 8.7–10.2)
Chloride: 103 mmol/L (ref 96–106)
Creatinine, Ser: 1.07 mg/dL — ABNORMAL HIGH (ref 0.57–1.00)
Globulin, Total: 3.1 g/dL (ref 1.5–4.5)
Glucose: 83 mg/dL (ref 70–99)
Potassium: 4.8 mmol/L (ref 3.5–5.2)
Sodium: 140 mmol/L (ref 134–144)
Total Protein: 7.5 g/dL (ref 6.0–8.5)
eGFR: 71 mL/min/{1.73_m2} (ref >59)

## 2021-08-20 LAB — HEMOGLOBIN A1C
Est. average glucose Bld gHb Est-mCnc: 120 mg/dL
Hgb A1c MFr Bld: 5.8 % — ABNORMAL HIGH (ref 4.8–5.6)

## 2021-08-20 LAB — TSH: TSH: 1.52 u[IU]/mL (ref 0.450–4.500)

## 2021-08-20 LAB — VITAMIN D 25 HYDROXY (VIT D DEFICIENCY, FRACTURES): Vit D, 25-Hydroxy: 26.3 ng/mL — ABNORMAL LOW (ref 30.0–100.0)

## 2021-08-20 NOTE — Assessment & Plan Note (Signed)
Intermittent nausea associated with menstruation. Will send prn zofran for severe symptoms.

## 2021-08-20 NOTE — Assessment & Plan Note (Signed)
Review of current and past medical history, social history, medication, and family history.  Review of care gaps and health maintenance recommendations.  Records from recent providers to be requested if not available in Chart Review or Care Everywhere.  Recommendations for health maintenance, diet, and exercise provided.   

## 2021-10-09 ENCOUNTER — Telehealth (HOSPITAL_BASED_OUTPATIENT_CLINIC_OR_DEPARTMENT_OTHER): Payer: Self-pay

## 2021-10-09 ENCOUNTER — Other Ambulatory Visit (HOSPITAL_BASED_OUTPATIENT_CLINIC_OR_DEPARTMENT_OTHER): Payer: Self-pay

## 2021-10-09 ENCOUNTER — Encounter (HOSPITAL_BASED_OUTPATIENT_CLINIC_OR_DEPARTMENT_OTHER): Payer: Self-pay | Admitting: Nurse Practitioner

## 2021-10-09 ENCOUNTER — Ambulatory Visit (HOSPITAL_BASED_OUTPATIENT_CLINIC_OR_DEPARTMENT_OTHER): Payer: BC Managed Care – PPO | Admitting: Nurse Practitioner

## 2021-10-09 VITALS — BP 128/88 | HR 83 | Temp 100.5°F | Ht 63.0 in | Wt 235.0 lb

## 2021-10-09 DIAGNOSIS — H66006 Acute suppurative otitis media without spontaneous rupture of ear drum, recurrent, bilateral: Secondary | ICD-10-CM | POA: Diagnosis not present

## 2021-10-09 DIAGNOSIS — H66001 Acute suppurative otitis media without spontaneous rupture of ear drum, right ear: Secondary | ICD-10-CM | POA: Diagnosis not present

## 2021-10-09 MED ORDER — FLUCONAZOLE 150 MG PO TABS
150.0000 mg | ORAL_TABLET | Freq: Once | ORAL | 1 refills | Status: AC
  Filled 2021-10-09: qty 1, 1d supply, fill #0

## 2021-10-09 MED ORDER — AMOXICILLIN-POT CLAVULANATE 875-125 MG PO TABS
1.0000 | ORAL_TABLET | Freq: Two times a day (BID) | ORAL | 0 refills | Status: DC
  Filled 2021-10-09: qty 10, 5d supply, fill #0

## 2021-10-09 NOTE — Telephone Encounter (Signed)
Patient called left message regarding work note. I called the patient back to see if she wanted to pick the letter up or have it placed in my chart. Left message for her to return call. ?

## 2021-10-09 NOTE — Progress Notes (Signed)
?  [name], DNP, AGNP-c ?Primary Care & Sports Medicine ?[address]  [address] ?[place], [place] [address] ?[phone] ? ?Subjective:  ? ?Michele Pennington is a 31 y.o. female presents to day for sore throat and right ear pain.  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 ?PMH, Medications, and Allergies reviewed and updated in chart.  ? ? ?Objective:  ?BP 128/88   Pulse 83   Temp (!) 100.5 ?F (38.1 ?C)   Ht 5\' 3"  (1.6 m)   Wt 235 lb (106.6 kg)   SpO2 95%   BMI 41.63 kg/m?  ?ROS negative except for what is listed in HPI. ? ?   Physical Exam ?Vitals and nursing note reviewed.  ?Constitutional:   ?   Appearance: She is well-developed. She is ill-appearing.  ?HENT:  ?   Head: Normocephalic.  ?   Right Ear: A middle ear effusion is present.  ?   Left Ear: A middle ear effusion is present.  ?   Nose: Congestion and rhinorrhea present.  ?   Mouth/Throat:  ?   Mouth: Mucous membranes are moist.  ?   Pharynx: Uvula midline. Posterior oropharyngeal erythema present. No pharyngeal swelling or oropharyngeal exudate.  ?   Tonsils: No tonsillar exudate.  ?Eyes:  ?   Conjunctiva/sclera: Conjunctivae normal.  ?   Pupils: Pupils are equal, round, and reactive to light.  ?Cardiovascular:  ?   Rate and Rhythm: Normal rate and regular rhythm.  ?   Heart sounds: Normal heart sounds.  ?Pulmonary:  ?   Effort: Pulmonary effort is normal.  ?   Breath sounds: Normal breath sounds.  ?Abdominal:  ?   Palpations: Abdomen is soft.  ?Musculoskeletal:  ?   Cervical back: Normal range of motion.  ?Lymphadenopathy:  ?   Cervical: Cervical adenopathy present.  ?Skin: ?   General: Skin is warm and dry.  ?   Capillary Refill: Capillary refill takes less than 2 seconds.  ?Neurological:  ?   General: No focal deficit present.   ?   Mental Status: She is alert and oriented to person, place, and time.  ?Psychiatric:     ?   Mood and Affect: Mood normal.     ?   Behavior: Behavior normal.  ? ? ? ?Assessment & Plan:  ? ?Recurrent acute suppurative otitis media without spontaneous rupture of tympanic membrane of both sides ?Symptoms and presentation consistent with acute otitis media. Strep negative. Will send treatment with Augmentin. Diflucan for possible yeast after antibiotic use.  ?If no improvement of symptoms after antibiotic completion, follow-up for further recommendations.  ? ? ?History and Medication reviewed and updated this encounter.  ? ? , DNP, AGNP-c ?10/14/2021  8:40 PM   ? ?

## 2021-10-09 NOTE — Patient Instructions (Signed)
It was a pleasure seeing you today. I hope your time spent with [place] was pleasant and helpful. Please let us know if there is anything we can do to improve the service you receive.  ? ?Today we discussed concerns with: ? ?Non-recurrent acute suppurative otitis media of right ear without spontaneous rupture of tympanic membrane ?I have sent Augmentin for you to take twice a day for 5 days for ear infection.  ?If you are not better by Friday morning, please let me know.  ?I also sent diflucan in case you get yeast symptoms.  ? ? ? ?Important Office Information ?Lab Results ?If labs were ordered, please note that you will see results through MyChart as soon as they come available from [facility].  ?It takes up to 5 business days for the results to be routed to me and for me to review them once all of the lab results have come through from [facility] I will make recommendations based on your results and send these through MyChart or someone from the office will call you to discuss. If your labs are abnormal, we may contact you to schedule a visit to discuss the results and make recommendations.  ?If you have not heard from [place] within 5 business days or you have waited longer than a week and your lab results have not come through on MyChart, please feel free to call the office or send a message through MyChart to follow-up on these labs.  ? ?Referrals ?If referrals were placed today, the office where the referral was sent will contact you either by phone or through MyChart to set up scheduling. Please note that it can take up to a week for the referral office to contact you. If you do not hear from them in a week, please contact the referral office directly to inquire about scheduling.  ? ?Condition Treated ?If your condition worsens or you begin to have new symptoms, please schedule a follow-up appointment for further evaluation. If you are not sure if an appointment is needed, you may call the office to leave a message for the  nurse and someone will contact you with recommendations.  ?If you have an urgent or life threatening emergency, please do not call the office, but seek emergency evaluation by calling 911 or going to the nearest emergency room for evaluation.  ? ?MyChart and Phone Calls ?Please do not use MyChart for urgent messages. It may take up to 3 business days for MyChart messages to be read by staff and if they are unable to handle the request, an additional 3 business days for them to be routed to me and for my response.  ?Messages sent to the provider through MyChart do not come directly to the provider, please allow time for these messages to be routed and for me to respond.  ?We get a large volume of MyChart messages daily and these are responded to in the order received.  ? ?For urgent messages, please call the office at [phone] and speak with the front office staff or leave a message on the line of my assistant for guidance.  ?We are seeing patients from the hours of 8:00 am through 5:00 pm and calls directly to the nurse may not be answered immediately due to seeing patients, but your call will be returned as soon as possible.  ?Phone  messages received after 4:00 PM Monday through Thursday may not be returned until the following business day. Phone messages received after 11:00 AM  on Friday may not be returned until Monday.  ? ?After Hours ?We share on call hours with providers from other offices. If you have an urgent need after hours that cannot wait until the next business day, please contact the on call provider by calling the office number. A nurse will speak with you and contact the provider if needed for recommendations.  ?If you have an urgent or life threatening emergency after hours, please do not call the on call provider, but seek emergency evaluation by calling 911 or going to the nearest emergency room for evaluation.  ? ?Paperwork ?All paperwork requires a minimum of 5 days to complete and return  to you or the designated personnel. Please keep this in mind when bringing in forms or sending requests for paperwork completion to the office.  ?  ?

## 2021-10-14 ENCOUNTER — Ambulatory Visit (INDEPENDENT_AMBULATORY_CARE_PROVIDER_SITE_OTHER): Payer: BC Managed Care – PPO | Admitting: Nurse Practitioner

## 2021-10-14 ENCOUNTER — Encounter (HOSPITAL_BASED_OUTPATIENT_CLINIC_OR_DEPARTMENT_OTHER): Payer: Self-pay | Admitting: Nurse Practitioner

## 2021-10-14 ENCOUNTER — Other Ambulatory Visit (HOSPITAL_BASED_OUTPATIENT_CLINIC_OR_DEPARTMENT_OTHER): Payer: Self-pay

## 2021-10-14 VITALS — BP 146/91 | HR 96 | Ht 63.0 in | Wt 234.0 lb

## 2021-10-14 DIAGNOSIS — R509 Fever, unspecified: Secondary | ICD-10-CM

## 2021-10-14 DIAGNOSIS — R059 Cough, unspecified: Secondary | ICD-10-CM | POA: Diagnosis not present

## 2021-10-14 DIAGNOSIS — H66006 Acute suppurative otitis media without spontaneous rupture of ear drum, recurrent, bilateral: Secondary | ICD-10-CM

## 2021-10-14 DIAGNOSIS — R062 Wheezing: Secondary | ICD-10-CM | POA: Diagnosis not present

## 2021-10-14 DIAGNOSIS — B3731 Acute candidiasis of vulva and vagina: Secondary | ICD-10-CM

## 2021-10-14 HISTORY — DX: Acute suppurative otitis media without spontaneous rupture of ear drum, recurrent, bilateral: H66.006

## 2021-10-14 HISTORY — DX: Wheezing: R06.2

## 2021-10-14 HISTORY — DX: Acute candidiasis of vulva and vagina: B37.31

## 2021-10-14 MED ORDER — FLUCONAZOLE 150 MG PO TABS
ORAL_TABLET | ORAL | 2 refills | Status: DC
  Filled 2021-10-14: qty 2, 2d supply, fill #0

## 2021-10-14 MED ORDER — HYDROCODONE BIT-HOMATROP MBR 5-1.5 MG/5ML PO SOLN
5.0000 mL | Freq: Three times a day (TID) | ORAL | 0 refills | Status: DC | PRN
  Filled 2021-10-14: qty 70, 5d supply, fill #0

## 2021-10-14 MED ORDER — PREDNISONE 20 MG PO TABS
20.0000 mg | ORAL_TABLET | Freq: Every day | ORAL | 0 refills | Status: DC
  Filled 2021-10-14: qty 5, 5d supply, fill #0

## 2021-10-14 MED ORDER — LEVOFLOXACIN 500 MG PO TABS
500.0000 mg | ORAL_TABLET | Freq: Every day | ORAL | 0 refills | Status: DC
  Filled 2021-10-14: qty 7, 7d supply, fill #0

## 2021-10-14 NOTE — Assessment & Plan Note (Signed)
Symptoms and presentation consistent with acute otitis media. Strep negative. Will send treatment with Augmentin. Diflucan for possible yeast after antibiotic use.  ?If no improvement of symptoms after antibiotic completion, follow-up for further recommendations.  ?

## 2021-10-14 NOTE — Progress Notes (Signed)
?[name], DNP, AGNP-c ?[facility] Medicine ?[address]  [address] ?[place], [place] [address] ?[phone] ? ?Subjective:  ? ?Michele Pennington is a 31 y.o. female presents to day for follow-up of ear pain.  ?Ear Pain ?Patient was last seen 04/12 and treated for otitis media with augmentin ?Today she tells me ?She initially began to feel better with antibiotic, but then symptoms came back ?Ears are hurting bilaterally the left > right ?Productive cough with green mucous ?Sore throat ?Subjective fevers ?Cough keeping up at night ?Body aches ?Sternal chest pain from coughing ?No sinus pain or pressure ?No GI symptoms ? ?PMH, Medications, and Allergies reviewed and updated in chart.  ? ? ?Objective:  ?BP (!) 146/91   Pulse 96   Ht 5\' 3"  (1.6 m)   Wt 234 lb (106.1 kg)   SpO2 99%   BMI 41.45 kg/m?  ?ROS negative except for what is listed in HPI. ? ?    ?Physical Exam ?Vitals and nursing note reviewed.  ?Constitutional:   ?   Appearance: She is well-developed. She is ill-appearing.  ?HENT:  ?   Head: Normocephalic.  ?   Right Ear: Ear canal normal. Tenderness present. No drainage or swelling. A middle ear effusion is present.  ?   Left Ear: Ear canal normal. Tenderness present. No drainage or swelling. A middle ear effusion is present.  ?   Nose: Congestion present. No rhinorrhea.  ?   Mouth/Throat:  ?   Mouth: Mucous membranes are moist.  ?   Pharynx: Uvula midline. Posterior oropharyngeal erythema present. No oropharyngeal exudate.  ?   Tonsils: No tonsillar exudate. 1+ on the right. 1+ on the left.  ?Eyes:  ?   Conjunctiva/sclera: Conjunctivae normal.  ?Cardiovascular:  ?   Rate and Rhythm: Normal rate and regular rhythm.  ?   Heart sounds: Normal heart sounds. No murmur heard. ?Pulmonary:  ?   Effort: Pulmonary effort is normal.  ?   Breath sounds: Wheezing and rhonchi present.  ?Chest:  ?   Chest wall: Tenderness present.  ?Abdominal:  ?   Palpations: Abdomen  is soft.  ?Musculoskeletal:  ?   Cervical back: Normal range of motion.  ?Lymphadenopathy:  ?   Cervical: Cervical adenopathy present.  ?Skin: ?   General: Skin is warm and dry.  ?   Capillary Refill: Capillary refill takes less than 2 seconds.  ?Neurological:  ?   General: No focal deficit present.  ?   Mental Status: She is alert.  ?Psychiatric:     ?   Mood and Affect: Mood normal.     ?   Behavior: Behavior normal.  ? ? ?Assessment & Plan:  ? ?Recurrent acute suppurative otitis media without spontaneous rupture of tympanic membrane of both sides ?Initial improvement of symptoms with antibiotics with recurrence. Family is ill with the same.  ?Suspect possible secondary infection present with bilateral suppurative fluid behind the TM, no signs of rupture present.  ?Will send treatment with levofloxacin given recent treatment with augmentin and recurrence of symptoms.  ?Recommend increased rest and hydration. Note provided for work through 04/18.  ?Patient will follow-up if symptoms worsen or fail to improve.  ? ?Vaginal candidiasis ?Vaginal discharge and itching present with recent antibiotic use. New antibiotics today for repeat infection. Will send diflucan for use.  ? ?Cough with fever ?Rhonchi and wheezing present bilaterally on auscultation with subjective fevers and sweats. No alarm symptoms present. Oxygenation  appropriate. Symptoms worsening over the weekend after initial improvement. Cough is keeping her up at night.  ?Will send levofloxacin, prednisone, and hycodan for treatment.  ?Recommend staying home from work today and tomorrow, note provided for patient.  ?If no improvement in next 3 days, recommend call the office for further recommendations.  ?Increase rest and hydration, cough and deep breath to help move mucous, utilize mucinex for improved effectiveness of cough.  ? ? ?Wheezing on both sides of chest ?Wheezing and rhonchi present bilaterally throughout all lung fields. Suspect CAP is present.  Initial sinusitis infection with improvement with antibiotic use then recurrence of infection symptoms and cough.  ?Will send antibiotic treatment and prednisone burst to help with inflammation and infection.  ?Patient encouraged to rest and hydrate.  ?If no improvement in 3 days, please call the office. May need albuterol inhaler, but will hold off on this for now.  ? ? ?History and Medication reviewed and updated this encounter.  ? ?[name], DNP, AGNP-c ?10/14/2021  9:34 AM   ? ?

## 2021-10-14 NOTE — Assessment & Plan Note (Signed)
Wheezing and rhonchi present bilaterally throughout all lung fields. Suspect CAP is present. Initial sinusitis infection with improvement with antibiotic use then recurrence of infection symptoms and cough.  ?Will send antibiotic treatment and prednisone burst to help with inflammation and infection.  ?Patient encouraged to rest and hydrate.  ?If no improvement in 3 days, please call the office. May need albuterol inhaler, but will hold off on this for now.  ?

## 2021-10-14 NOTE — Assessment & Plan Note (Signed)
Vaginal discharge and itching present with recent antibiotic use. New antibiotics today for repeat infection. Will send diflucan for use.  ?

## 2021-10-14 NOTE — Assessment & Plan Note (Signed)
Initial improvement of symptoms with antibiotics with recurrence. Family is ill with the same.  ?Suspect possible secondary infection present with bilateral suppurative fluid behind the TM, no signs of rupture present.  ?Will send treatment with levofloxacin given recent treatment with augmentin and recurrence of symptoms.  ?Recommend increased rest and hydration. Note provided for work through 04/18.  ?Patient will follow-up if symptoms worsen or fail to improve.  ?

## 2021-10-14 NOTE — Patient Instructions (Addendum)
It was a pleasure seeing you today. I hope your time spent with [place] was pleasant and helpful. Please let us know if there is anything we can do to improve the service you receive.  ? ?Today we discussed concerns with: ?Cough, fevers, ear pain ? ?If this does not get any better in the next 3 days.  ? ? ? ? ? ?Important Office Information ?Lab Results ?If labs were ordered, please note that you will see results through MyChart as soon as they come available from [facility].  ?It takes up to 5 business days for the results to be routed to me and for me to review them once all of the lab results have come through from [facility]. I will make recommendations based on your results and send these through MyChart or someone from the office will call you to discuss. If your labs are abnormal, we may contact you to schedule a visit to discuss the results and make recommendations.  ?If you have not heard from [place] within 5 business days or you have waited longer than a week and your lab results have not come through on MyChart, please feel free to call the office or send a message through MyChart to follow-up on these labs.  ? ?Referrals ?If referrals were placed today, the office where the referral was sent will contact you either by phone or through MyChart to set up scheduling. Please note that it can take up to a week for the referral office to contact you. If you do not hear from them in a week, please contact the referral office directly to inquire about scheduling.  ? ?Condition Treated ?If your condition worsens or you begin to have new symptoms, please schedule a follow-up appointment for further evaluation. If you are not sure if an appointment is needed, you may call the office to leave a message for the nurse and someone will contact you with recommendations.  ?If you have an urgent or life threatening emergency, please do not call the office, but seek emergency evaluation by calling 911 or going to the nearest emergency  room for evaluation.  ? ?MyChart and Phone Calls ?Please do not use MyChart for urgent messages. It may take up to 3 business days for MyChart messages to be read by staff and if they are unable to handle the request, an additional 3 business days for them to be routed to me and for my response.  ?Messages sent to the provider through MyChart do not come directly to the provider, please allow time for these messages to be routed and for me to respond.  ?We get a large volume of MyChart messages daily and these are responded to in the order received.  ? ?For urgent messages, please call the office at [phone] and speak with the front office staff or leave a message on the line of my assistant for guidance.  ?We are seeing patients from the hours of 8:00 am through 5:00 pm and calls directly to the nurse may not be answered immediately due to seeing patients, but your call will be returned as soon as possible.  ?Phone  messages received after 4:00 PM Monday through Thursday may not be returned until the following business day. Phone messages received after 11:00 AM on Friday may not be returned until Monday.  ? ?After Hours ?We share on call hours with providers from other offices. If you have an urgent need after hours that cannot wait until the next business  day, please contact the on call provider by calling the office number. A nurse will speak with you and contact the provider if needed for recommendations.  ?If you have an urgent or life threatening emergency after hours, please do not call the on call provider, but seek emergency evaluation by calling 911 or going to the nearest emergency room for evaluation.  ? ?Paperwork ?All paperwork requires a minimum of 5 days to complete and return to you or the designated personnel. Please keep this in mind when bringing in forms or sending requests for paperwork completion to the office.  ?  ?

## 2021-10-14 NOTE — Assessment & Plan Note (Signed)
Rhonchi and wheezing present bilaterally on auscultation with subjective fevers and sweats. No alarm symptoms present. Oxygenation appropriate. Symptoms worsening over the weekend after initial improvement. Cough is keeping her up at night.  ?Will send levofloxacin, prednisone, and hycodan for treatment.  ?Recommend staying home from work today and tomorrow, note provided for patient.  ?If no improvement in next 3 days, recommend call the office for further recommendations.  ?Increase rest and hydration, cough and deep breath to help move mucous, utilize mucinex for improved effectiveness of cough.  ? ?

## 2021-11-26 ENCOUNTER — Encounter (HOSPITAL_BASED_OUTPATIENT_CLINIC_OR_DEPARTMENT_OTHER): Payer: Self-pay | Admitting: Nurse Practitioner

## 2021-11-26 ENCOUNTER — Ambulatory Visit (INDEPENDENT_AMBULATORY_CARE_PROVIDER_SITE_OTHER): Payer: BC Managed Care – PPO | Admitting: Nurse Practitioner

## 2021-11-26 ENCOUNTER — Other Ambulatory Visit (HOSPITAL_BASED_OUTPATIENT_CLINIC_OR_DEPARTMENT_OTHER): Payer: Self-pay

## 2021-11-26 DIAGNOSIS — H66006 Acute suppurative otitis media without spontaneous rupture of ear drum, recurrent, bilateral: Secondary | ICD-10-CM

## 2021-11-26 LAB — POCT RAPID STREP A (OFFICE): Rapid Strep A Screen: NEGATIVE

## 2021-11-26 MED ORDER — FLUCONAZOLE 150 MG PO TABS
ORAL_TABLET | ORAL | 2 refills | Status: DC
  Filled 2021-11-26: qty 2, 3d supply, fill #0

## 2021-11-26 MED ORDER — PREDNISONE 20 MG PO TABS
20.0000 mg | ORAL_TABLET | Freq: Every day | ORAL | 0 refills | Status: DC
  Filled 2021-11-26: qty 5, 5d supply, fill #0

## 2021-11-26 MED ORDER — LEVOFLOXACIN 500 MG PO TABS
500.0000 mg | ORAL_TABLET | Freq: Every day | ORAL | 0 refills | Status: AC
  Filled 2021-11-26: qty 5, 5d supply, fill #0

## 2021-11-26 NOTE — Assessment & Plan Note (Signed)
Recurrence of otitis media bilaterally.  Unfortunately patient has experienced several episodes of purulent otitis media in the past 6 months.  Most recent infection approximately 6 weeks ago required Levaquin and steroid antibiotics for treatment.  Based on recent illness and symptoms present today we will begin treatment with 5 days of Levaquin and steroid burst.  Encouraged the patient to stay home and rest rest of the day and tomorrow.  As long as she is fever free she may return to work on Thursday.  Strep test negative in the office today.  Monitor closely for signs of strep or white patches in the throat since she recently had exposure with her son.  Follow-up if symptoms worsen or fail to improve.

## 2021-11-26 NOTE — Progress Notes (Signed)
Michele Eth, DNP, AGNP-c [facility] [address]  [address] [place], [place] [address] [phone]  Subjective:   Michele Pennington is a 31 y.o. female presents to day for sore throat and ear pain Sore throat and ear pain Symptoms started on Friday of last week with mild sore throat and congestion with ear pain Patient's son had been home from school sick with strep She began to feel worse over the weekend with increased pain and pressure in her ears bilaterally. She is not sure if she has had a fever She did take off of work today for this appointment No difficulty breathing, wheezing, chest pain.  PMH, Medications, and Allergies reviewed and updated in chart.   ROS negative except for what is listed in HPI. Objective:  There were no vitals taken for this visit. Physical Exam Vitals and nursing note reviewed.  Constitutional:      Appearance: She is well-developed. She is ill-appearing.  HENT:     Head: Normocephalic.     Right Ear: Swelling and tenderness present. A middle ear effusion is present. Tympanic membrane is erythematous.     Left Ear: Swelling and tenderness present. A middle ear effusion is present. Tympanic membrane is erythematous.     Nose: Congestion and rhinorrhea present.     Mouth/Throat:     Mouth: Mucous membranes are moist.     Pharynx: Oropharynx is clear. Uvula midline. Posterior oropharyngeal erythema present. No oropharyngeal exudate or uvula swelling.     Tonsils: No tonsillar exudate or tonsillar abscesses.  Eyes:     Conjunctiva/sclera: Conjunctivae normal.     Pupils: Pupils are equal, round, and reactive to light.  Cardiovascular:     Rate and Rhythm: Normal rate and regular rhythm.     Heart sounds: Normal heart sounds. No murmur heard. Pulmonary:     Effort: Pulmonary effort is normal.     Breath sounds: Normal breath sounds.  Abdominal:     General: Bowel sounds are normal.      Palpations: Abdomen is soft.  Musculoskeletal:     Cervical back: Normal range of motion and neck supple.  Lymphadenopathy:     Cervical: Cervical adenopathy present.  Skin:    General: Skin is warm and dry.     Capillary Refill: Capillary refill takes less than 2 seconds.  Neurological:     General: No focal deficit present.     Mental Status: She is alert and oriented to person, place, and time.  Psychiatric:        Mood and Affect: Mood normal.        Behavior: Behavior normal.          Assessment & Plan:   Problem List Items Addressed This Visit     Recurrent acute suppurative otitis media without spontaneous rupture of tympanic membrane of both sides - Primary    Recurrence of otitis media bilaterally.  Unfortunately patient has experienced several episodes of purulent otitis media in the past 6 months.  Most recent infection approximately 6 weeks ago required Levaquin and steroid antibiotics for treatment.  Based on recent illness and symptoms present today we will begin treatment with 5 days of Levaquin and steroid burst.  Encouraged the patient to stay home and rest rest of the day and tomorrow.  As long as she is fever free she may return to work on Thursday.  Strep test negative in the office today.  Monitor closely  for signs of strep or white patches in the throat since she recently had exposure with her son.  Follow-up if symptoms worsen or fail to improve.       Relevant Medications   levofloxacin (LEVAQUIN) 500 MG tablet   fluconazole (DIFLUCAN) 150 MG tablet   predniSONE (DELTASONE) 20 MG tablet   Other Relevant Orders   POCT rapid strep A (Completed)     Michele Eth, DNP, AGNP-c 11/26/2021  8:03 PM

## 2021-11-26 NOTE — Patient Instructions (Signed)
Please let me know if this does not get any better.

## 2022-01-01 ENCOUNTER — Other Ambulatory Visit (HOSPITAL_BASED_OUTPATIENT_CLINIC_OR_DEPARTMENT_OTHER): Payer: Self-pay

## 2022-01-01 MED ORDER — CYCLOBENZAPRINE HCL 5 MG PO TABS
5.0000 mg | ORAL_TABLET | Freq: Three times a day (TID) | ORAL | 0 refills | Status: DC | PRN
  Filled 2022-01-01: qty 30, 10d supply, fill #0

## 2022-02-26 ENCOUNTER — Encounter (HOSPITAL_BASED_OUTPATIENT_CLINIC_OR_DEPARTMENT_OTHER): Payer: Self-pay | Admitting: Nurse Practitioner

## 2022-02-26 ENCOUNTER — Ambulatory Visit (INDEPENDENT_AMBULATORY_CARE_PROVIDER_SITE_OTHER): Payer: BC Managed Care – PPO

## 2022-02-26 ENCOUNTER — Ambulatory Visit (HOSPITAL_BASED_OUTPATIENT_CLINIC_OR_DEPARTMENT_OTHER): Payer: BC Managed Care – PPO | Admitting: Nurse Practitioner

## 2022-02-26 DIAGNOSIS — M25431 Effusion, right wrist: Secondary | ICD-10-CM

## 2022-02-26 DIAGNOSIS — M25531 Pain in right wrist: Secondary | ICD-10-CM | POA: Diagnosis not present

## 2022-02-26 DIAGNOSIS — M545 Low back pain, unspecified: Secondary | ICD-10-CM | POA: Diagnosis not present
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
## 2022-02-26 NOTE — Patient Instructions (Signed)
It is possible that the wrist does have a small fracture in the hand that can be causing the pain and weakness. It could also be a sprain and inflammation.   I would like to get an x-ray and see what this shows today. This will help me decided what next steps to take. I have put information on here for both the fracture and the sprain for you to review.   If there is a fracture present, I don't want you to start the stretches. If no fracture is present you can start the stretches as tolerated.   I will send in PT for your back.

## 2022-03-26 ENCOUNTER — Ambulatory Visit (HOSPITAL_BASED_OUTPATIENT_CLINIC_OR_DEPARTMENT_OTHER): Payer: BC Managed Care – PPO | Admitting: Physical Therapy

## 2022-03-29 DIAGNOSIS — M25431 Effusion, right wrist: Secondary | ICD-10-CM

## 2022-03-29 DIAGNOSIS — M545 Low back pain, unspecified: Secondary | ICD-10-CM

## 2022-03-29 HISTORY — DX: Effusion, right wrist: M25.431

## 2022-03-29 HISTORY — DX: Low back pain, unspecified: M54.50
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
## 2022-03-31 ENCOUNTER — Encounter (HOSPITAL_BASED_OUTPATIENT_CLINIC_OR_DEPARTMENT_OTHER): Payer: Self-pay | Admitting: Physical Therapy

## 2022-03-31 ENCOUNTER — Ambulatory Visit (HOSPITAL_BASED_OUTPATIENT_CLINIC_OR_DEPARTMENT_OTHER): Payer: BC Managed Care – PPO | Attending: Nurse Practitioner | Admitting: Physical Therapy

## 2022-03-31 DIAGNOSIS — M545 Low back pain, unspecified: Secondary | ICD-10-CM | POA: Diagnosis not present

## 2022-03-31 DIAGNOSIS — M5459 Other low back pain: Secondary | ICD-10-CM

## 2022-03-31 DIAGNOSIS — R293 Abnormal posture: Secondary | ICD-10-CM

## 2022-03-31 DIAGNOSIS — R29898 Other symptoms and signs involving the musculoskeletal system: Secondary | ICD-10-CM
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
## 2022-04-02 ENCOUNTER — Ambulatory Visit (HOSPITAL_BASED_OUTPATIENT_CLINIC_OR_DEPARTMENT_OTHER): Payer: BC Managed Care – PPO | Admitting: Physical Therapy
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
## 2022-04-08 ENCOUNTER — Encounter (HOSPITAL_BASED_OUTPATIENT_CLINIC_OR_DEPARTMENT_OTHER): Payer: Self-pay | Admitting: Physical Therapy

## 2022-04-08 ENCOUNTER — Ambulatory Visit (HOSPITAL_BASED_OUTPATIENT_CLINIC_OR_DEPARTMENT_OTHER): Payer: BC Managed Care – PPO | Admitting: Physical Therapy

## 2022-04-08 DIAGNOSIS — M5459 Other low back pain: Secondary | ICD-10-CM

## 2022-04-08 DIAGNOSIS — M545 Low back pain, unspecified: Secondary | ICD-10-CM | POA: Diagnosis not present

## 2022-04-08 DIAGNOSIS — R293 Abnormal posture: Secondary | ICD-10-CM

## 2022-04-08 DIAGNOSIS — R29898 Other symptoms and signs involving the musculoskeletal system: Secondary | ICD-10-CM
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
## 2022-04-15 ENCOUNTER — Ambulatory Visit (HOSPITAL_BASED_OUTPATIENT_CLINIC_OR_DEPARTMENT_OTHER): Payer: BC Managed Care – PPO | Admitting: Physical Therapy
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
## 2022-04-22 ENCOUNTER — Ambulatory Visit (HOSPITAL_BASED_OUTPATIENT_CLINIC_OR_DEPARTMENT_OTHER): Payer: BC Managed Care – PPO | Admitting: Physical Therapy

## 2022-04-22 ENCOUNTER — Encounter (HOSPITAL_BASED_OUTPATIENT_CLINIC_OR_DEPARTMENT_OTHER): Payer: Self-pay | Admitting: Physical Therapy

## 2022-04-22 DIAGNOSIS — R29898 Other symptoms and signs involving the musculoskeletal system: Secondary | ICD-10-CM

## 2022-04-22 DIAGNOSIS — M5459 Other low back pain: Secondary | ICD-10-CM

## 2022-04-22 DIAGNOSIS — M545 Low back pain, unspecified: Secondary | ICD-10-CM | POA: Diagnosis not present

## 2022-04-22 DIAGNOSIS — R293 Abnormal posture: Secondary | ICD-10-CM

## 2022-04-29 ENCOUNTER — Ambulatory Visit (HOSPITAL_BASED_OUTPATIENT_CLINIC_OR_DEPARTMENT_OTHER): Payer: BC Managed Care – PPO | Admitting: Physical Therapy
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
## 2022-05-05 ENCOUNTER — Other Ambulatory Visit (HOSPITAL_BASED_OUTPATIENT_CLINIC_OR_DEPARTMENT_OTHER): Payer: Self-pay

## 2022-05-05 ENCOUNTER — Ambulatory Visit (INDEPENDENT_AMBULATORY_CARE_PROVIDER_SITE_OTHER): Payer: BC Managed Care – PPO | Admitting: Nurse Practitioner

## 2022-05-05 ENCOUNTER — Encounter (HOSPITAL_BASED_OUTPATIENT_CLINIC_OR_DEPARTMENT_OTHER): Payer: Self-pay | Admitting: Nurse Practitioner

## 2022-05-05 VITALS — BP 130/92 | HR 81 | Temp 100.2°F | Ht 63.0 in | Wt 230.0 lb

## 2022-05-05 DIAGNOSIS — J029 Acute pharyngitis, unspecified: Secondary | ICD-10-CM | POA: Diagnosis not present

## 2022-05-05 DIAGNOSIS — J111 Influenza due to unidentified influenza virus with other respiratory manifestations: Secondary | ICD-10-CM | POA: Diagnosis not present

## 2022-05-05 LAB — POCT RAPID STREP A (OFFICE): Rapid Strep A Screen: NEGATIVE

## 2022-05-05 LAB — POC COVID19 BINAXNOW: SARS Coronavirus 2 Ag: NEGATIVE

## 2022-05-05 LAB — POCT INFLUENZA A/B
Influenza A, POC: POSITIVE — AB
Influenza B, POC: NEGATIVE

## 2022-05-05 MED ORDER — OSELTAMIVIR PHOSPHATE 75 MG PO CAPS
75.0000 mg | ORAL_CAPSULE | Freq: Two times a day (BID) | ORAL | 0 refills | Status: DC
  Filled 2022-05-05: qty 10, 5d supply, fill #0

## 2022-05-05 MED ORDER — BENZONATATE 200 MG PO CAPS
200.0000 mg | ORAL_CAPSULE | Freq: Three times a day (TID) | ORAL | 0 refills | Status: DC | PRN
  Filled 2022-05-05: qty 45, 15d supply, fill #0

## 2022-05-05 MED ORDER — HYDROCODONE BIT-HOMATROP MBR 5-1.5 MG/5ML PO SOLN
5.0000 mL | Freq: Three times a day (TID) | ORAL | 0 refills | Status: DC | PRN
  Filled 2022-05-05: qty 120, 8d supply, fill #0

## 2022-05-05 NOTE — Progress Notes (Signed)
Orma Render, DNP, AGNP-c [facility] [address]  [place], [place] [address] [phone]  Subjective:   Michele Pennington is a 31 y.o. female presents to day for evaluation of: Acute Visit (Pt presents today with sore throat, congestion and cough. )  Sore throat, congestion, cough Michele Pennington endorses symptoms of sore throat, cough, headache, congestion, fevers, chills, and body aches. Her symptoms started on Saturday night and worsened throughout the night. She tells me that yesterday she felt very bad and she was unable to sleep well last night due to the symptoms. She was with family on Saturday, but is not aware of any direct sick contacts. She denies shortness of breath, dizziness, or inability to control fevers.   PMH, Medications, and Allergies reviewed and updated in chart as appropriate.   ROS negative except for what is listed in HPI. Objective:  BP (!) 130/92   Pulse 81   Temp 100.2 F (37.9 C)   Ht 5\' 3"  (1.6 m)   Wt 230 lb (104.3 kg)   SpO2 100%   BMI 40.74 kg/m  Physical Exam Vitals and nursing note reviewed.  Constitutional:      General: She is not in acute distress.    Appearance: She is ill-appearing.  HENT:     Head: Normocephalic and atraumatic.     Nose:     Comments: Mask in place    Mouth/Throat:     Comments: Mask in place Eyes:     Extraocular Movements: Extraocular movements intact.     Pupils: Pupils are equal, round, and reactive to light.  Cardiovascular:     Rate and Rhythm: Normal rate and regular rhythm.     Pulses: Normal pulses.     Heart sounds: Normal heart sounds.  Pulmonary:     Effort: Pulmonary effort is normal.     Breath sounds: Normal breath sounds. No wheezing.  Musculoskeletal:     Cervical back: Normal range of motion.  Lymphadenopathy:     Cervical: Cervical adenopathy present.  Skin:    General: Skin is warm and dry.     Capillary Refill: Capillary  refill takes less than 2 seconds.  Neurological:     General: No focal deficit present.     Mental Status: She is alert and oriented to person, place, and time.  Psychiatric:        Mood and Affect: Mood normal.        Behavior: Behavior normal.        Thought Content: Thought content normal.        Judgment: Judgment normal.           Assessment & Plan:   Problem List Items Addressed This Visit     Influenza - Primary    Rapid flu test positive in office today. Symptoms have been present for only about 36 hours at this time, therefore appropriate to start tamiflu for treatment. Her mother has been in close contact with her and is over 95 years of age, therefore will send prophylactic treatment for her also. No alarm symptoms present at this time. Supportive care information discussed and provided on AVS. Will follow-up if symptoms worsen or fail to improve.       Relevant Medications   oseltamivir (TAMIFLU) 75 MG capsule   HYDROcodone bit-homatropine (HYCODAN) 5-1.5 MG/5ML syrup   benzonatate (TESSALON) 200 MG capsule   Other Visit Diagnoses     Sore throat  Relevant Orders   POCT Influenza A/B (Completed)   POC COVID-19 (Completed)   POCT rapid strep A (Completed)         [name], DNP, AGNP-c 05/05/2022  12:48 PM    History, Medications, Surgery, SDOH, and Family History reviewed and updated as appropriate.

## 2022-05-05 NOTE — Patient Instructions (Addendum)
Your flu test was positive.  It is very important that you rest and stay very well-hydrated while you are recovering.    You can alternate Tylenol 1000 mg and ibuprofen up to 800 mg every 4 hours as needed for fevers body aches just general headache not feeling well.  I have sent in the Tamiflu for you will take that twice a day for 5 days.  Also sent in the Hycodan cough syrup you can take that up to every 8 hours as needed for cough and congestion. This will make you sleepy- so do not drive while taking this medication.   You can continue to take DayQuil or NyQuil with these medicines that safe to do.  Just make sure that you are accounting for the ibuprofen or Tylenol but in that medication and your Tylenol and ibuprofen dosing.

## 2022-05-05 NOTE — Assessment & Plan Note (Signed)
Rapid flu test positive in office today. Symptoms have been present for only about 36 hours at this time, therefore appropriate to start tamiflu for treatment. Her mother has been in close contact with her and is over 32 years of age, therefore will send prophylactic treatment for her also. No alarm symptoms present at this time. Supportive care information discussed and provided on AVS. Will follow-up if symptoms worsen or fail to improve.

## 2022-05-06 ENCOUNTER — Ambulatory Visit (HOSPITAL_BASED_OUTPATIENT_CLINIC_OR_DEPARTMENT_OTHER): Payer: BC Managed Care – PPO | Admitting: Physical Therapy
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
## 2022-05-13 ENCOUNTER — Ambulatory Visit (HOSPITAL_BASED_OUTPATIENT_CLINIC_OR_DEPARTMENT_OTHER): Payer: BC Managed Care – PPO | Admitting: Physical Therapy

## 2022-12-19 ENCOUNTER — Other Ambulatory Visit: Payer: Self-pay | Admitting: Obstetrics and Gynecology

## 2022-12-19 DIAGNOSIS — N6324 Unspecified lump in the left breast, lower inner quadrant: Secondary | ICD-10-CM

## 2022-12-19 DIAGNOSIS — N644 Mastodynia: Secondary | ICD-10-CM

## 2022-12-19 DIAGNOSIS — N6452 Nipple discharge: Secondary | ICD-10-CM

## 2022-12-25 ENCOUNTER — Ambulatory Visit
Admission: RE | Admit: 2022-12-25 | Discharge: 2022-12-25 | Disposition: A | Discharge status: Home / Self Care | Payer: BC Managed Care – PPO | Source: Ambulatory Visit | Attending: Obstetrics and Gynecology | Admitting: Obstetrics and Gynecology

## 2022-12-25 ENCOUNTER — Other Ambulatory Visit: Payer: Self-pay | Admitting: Obstetrics and Gynecology

## 2022-12-25 DIAGNOSIS — N644 Mastodynia: Secondary | ICD-10-CM

## 2022-12-25 DIAGNOSIS — N6452 Nipple discharge: Secondary | ICD-10-CM

## 2022-12-25 DIAGNOSIS — N6324 Unspecified lump in the left breast, lower inner quadrant: Secondary | ICD-10-CM

## 2022-12-26 ENCOUNTER — Other Ambulatory Visit: Payer: Self-pay | Admitting: Obstetrics and Gynecology

## 2022-12-26 DIAGNOSIS — N611 Abscess of the breast and nipple: Secondary | ICD-10-CM

## 2023-01-05 ENCOUNTER — Encounter (HOSPITAL_BASED_OUTPATIENT_CLINIC_OR_DEPARTMENT_OTHER): Payer: Self-pay

## 2023-01-05 ENCOUNTER — Emergency Department (HOSPITAL_BASED_OUTPATIENT_CLINIC_OR_DEPARTMENT_OTHER): Payer: BC Managed Care – PPO

## 2023-01-05 ENCOUNTER — Emergency Department (HOSPITAL_BASED_OUTPATIENT_CLINIC_OR_DEPARTMENT_OTHER): Payer: BC Managed Care – PPO | Admitting: Radiology

## 2023-01-05 ENCOUNTER — Emergency Department (HOSPITAL_BASED_OUTPATIENT_CLINIC_OR_DEPARTMENT_OTHER)
Admission: EM | Admit: 2023-01-05 | Discharge: 2023-01-05 | Disposition: A | Discharge status: Home / Self Care | Payer: BC Managed Care – PPO | Attending: Emergency Medicine | Admitting: Emergency Medicine

## 2023-01-05 ENCOUNTER — Other Ambulatory Visit (HOSPITAL_BASED_OUTPATIENT_CLINIC_OR_DEPARTMENT_OTHER): Payer: Self-pay

## 2023-01-05 ENCOUNTER — Encounter (HOSPITAL_BASED_OUTPATIENT_CLINIC_OR_DEPARTMENT_OTHER): Payer: Self-pay | Admitting: Family Medicine

## 2023-01-05 ENCOUNTER — Ambulatory Visit (HOSPITAL_BASED_OUTPATIENT_CLINIC_OR_DEPARTMENT_OTHER): Payer: BC Managed Care – PPO | Admitting: Family Medicine

## 2023-01-05 ENCOUNTER — Other Ambulatory Visit: Payer: Self-pay

## 2023-01-05 VITALS — BP 80/54 | HR 58 | Temp 99.7°F | Ht 63.0 in | Wt 240.0 lb

## 2023-01-05 DIAGNOSIS — M79671 Pain in right foot: Secondary | ICD-10-CM | POA: Diagnosis not present

## 2023-01-05 DIAGNOSIS — D649 Anemia, unspecified: Secondary | ICD-10-CM | POA: Diagnosis not present

## 2023-01-05 DIAGNOSIS — I959 Hypotension, unspecified: Secondary | ICD-10-CM

## 2023-01-05 DIAGNOSIS — Z1152 Encounter for screening for COVID-19: Secondary | ICD-10-CM | POA: Insufficient documentation

## 2023-01-05 DIAGNOSIS — R531 Weakness: Secondary | ICD-10-CM

## 2023-01-05 DIAGNOSIS — R42 Dizziness and giddiness: Secondary | ICD-10-CM | POA: Diagnosis present

## 2023-01-05 LAB — COMPREHENSIVE METABOLIC PANEL
ALT: 29 U/L (ref 0–44)
AST: 38 U/L (ref 15–41)
Albumin: 4.1 g/dL (ref 3.5–5.0)
Alkaline Phosphatase: 40 U/L (ref 38–126)
Anion gap: 12 (ref 5–15)
BUN: 11 mg/dL (ref 6–20)
CO2: 20 mmol/L — ABNORMAL LOW (ref 22–32)
Calcium: 9.2 mg/dL (ref 8.9–10.3)
Chloride: 103 mmol/L (ref 98–111)
Creatinine, Ser: 1.01 mg/dL — ABNORMAL HIGH (ref 0.44–1.00)
GFR, Estimated: >60 mL/min (ref >60)
Glucose, Bld: 119 mg/dL — ABNORMAL HIGH (ref 70–99)
Potassium: 3.9 mmol/L (ref 3.5–5.1)
Sodium: 135 mmol/L (ref 135–145)
Total Bilirubin: 0.2 mg/dL — ABNORMAL LOW (ref 0.3–1.2)
Total Protein: 7.8 g/dL (ref 6.5–8.1)

## 2023-01-05 LAB — CBC WITH DIFFERENTIAL/PLATELET
Abs Immature Granulocytes: 0.01 10*3/uL (ref 0.00–0.07)
Basophils Absolute: 0 10*3/uL (ref 0.0–0.1)
Basophils Relative: 0 %
Eosinophils Absolute: 0.1 10*3/uL (ref 0.0–0.5)
Eosinophils Relative: 2 %
HCT: 34.9 % — ABNORMAL LOW (ref 36.0–46.0)
Hemoglobin: 10.8 g/dL — ABNORMAL LOW (ref 12.0–15.0)
Immature Granulocytes: 0 %
Lymphocytes Relative: 34 %
Lymphs Abs: 1.8 10*3/uL (ref 0.7–4.0)
MCH: 23.3 pg — ABNORMAL LOW (ref 26.0–34.0)
MCHC: 30.9 g/dL (ref 30.0–36.0)
MCV: 75.4 fL — ABNORMAL LOW (ref 80.0–100.0)
Monocytes Absolute: 0.7 10*3/uL (ref 0.1–1.0)
Monocytes Relative: 13 %
Neutro Abs: 2.7 10*3/uL (ref 1.7–7.7)
Neutrophils Relative %: 51 %
Platelets: 347 10*3/uL (ref 150–400)
RBC: 4.63 MIL/uL (ref 3.87–5.11)
RDW: 15.2 % (ref 11.5–15.5)
WBC: 5.4 10*3/uL (ref 4.0–10.5)
nRBC: 0 % (ref 0.0–0.2)

## 2023-01-05 LAB — URINALYSIS, ROUTINE W REFLEX MICROSCOPIC
Bilirubin Urine: NEGATIVE
Glucose, UA: NEGATIVE mg/dL
Hgb urine dipstick: NEGATIVE
Ketones, ur: 15 mg/dL — AB
Leukocytes,Ua: NEGATIVE
Nitrite: NEGATIVE
Specific Gravity, Urine: 1.025 (ref 1.005–1.030)
pH: 6 (ref 5.0–8.0)

## 2023-01-05 LAB — GLUCOSE, POCT (MANUAL RESULT ENTRY): POC Glucose: 131 mg/dl — AB (ref 70–99)

## 2023-01-05 LAB — SARS CORONAVIRUS 2 BY RT PCR: SARS Coronavirus 2 by RT PCR: NEGATIVE

## 2023-01-05 LAB — PREGNANCY, URINE: Preg Test, Ur: NEGATIVE

## 2023-01-05 LAB — CK: Total CK: 90 U/L (ref 38–234)

## 2023-01-05 MED ORDER — FERROUS SULFATE 325 (65 FE) MG PO TABS
325.0000 mg | ORAL_TABLET | Freq: Every day | ORAL | 0 refills | Status: DC
  Filled 2023-01-05: qty 30, 30d supply, fill #0

## 2023-01-05 MED ORDER — METOCLOPRAMIDE HCL 5 MG/ML IJ SOLN
10.0000 mg | Freq: Once | INTRAMUSCULAR | Status: AC
  Administered 2023-01-05: 10 mg via INTRAVENOUS
  Filled 2023-01-05: qty 2

## 2023-01-05 MED ORDER — SODIUM CHLORIDE 0.9 % IV BOLUS
1000.0000 mL | Freq: Once | INTRAVENOUS | Status: AC
  Administered 2023-01-05: 1000 mL via INTRAVENOUS

## 2023-01-05 MED ORDER — IBUPROFEN 600 MG PO TABS
600.0000 mg | ORAL_TABLET | Freq: Four times a day (QID) | ORAL | 0 refills | Status: DC | PRN
  Filled 2023-01-05: qty 30, 8d supply, fill #0

## 2023-01-05 MED ORDER — DIPHENHYDRAMINE HCL 50 MG/ML IJ SOLN
25.0000 mg | Freq: Once | INTRAMUSCULAR | Status: AC
  Administered 2023-01-05: 25 mg via INTRAVENOUS
  Filled 2023-01-05: qty 1

## 2023-01-05 MED ORDER — KETOROLAC TROMETHAMINE 15 MG/ML IJ SOLN
15.0000 mg | Freq: Once | INTRAMUSCULAR | Status: AC
  Administered 2023-01-05: 15 mg via INTRAVENOUS
  Filled 2023-01-05: qty 1

## 2023-01-05 NOTE — ED Provider Notes (Signed)
[facility] AT [facility] Provider Note   CSN: [id number] Arrival date & time: 01/05/23  1049     History {Add pertinent medical, surgical, social history, OB history to HPI:1} Chief Complaint  Patient presents with   Dizziness    Michele Pennington is a 32 y.o. female.   Dizziness   32 year old female presents emergency department with complaints of generalized fatigue, diffuse bodyaches, decreased p.o. intake, nausea, lightheadedness.  Patient states that she has been with symptoms since Friday of last week.  States that she has had little to eat or drink since symptom onset.  Endorses episode earlier today where she stood up in the shower and felt lightheaded.  She states that she subsequently slipped and she fell landed on her right ankle as well as her right wrist.  Denies trauma to head, loss of consciousness, blood thinner use.  States has been able to ambulate but with "hobble" since then due to right ankle pain.  Reports subjective fever/chills at home.  Denies any chest pain, shortness of breath, abdominal pain, vomiting, urinary symptoms, vaginal symptoms, change in bowel habits.  Reports recently being diagnosed with left-sided breast "infection" being placed on antibiotics.  Patient states that area has significantly improved in pain, swelling and redness since being placed on doxycycline.  Denies any visual disturbance, gait abnormality, slurred speech, facial droop, weakness or sensory deficits in upper or lower extremities.    Past medical history significant for hypertension, lumbar pain  Home Medications Prior to Admission medications   Medication Sig Start Date End Date Taking? Authorizing Provider  acetaminophen (TYLENOL) 500 MG tablet Take 1,000 mg by mouth every 6 (six) hours as needed for mild pain, moderate pain or fever.    [provider]  Chlorpheniramine-Acetaminophen (CORICIDIN HBP COLD/FLU PO)  11/25/21   [provider]  doxycycline (VIBRAMYCIN) 100 MG capsule Take 100 mg by mouth 2 (two) times daily. 12/25/22   [provider]      Allergies    Patient has no known allergies.    Review of Systems   Review of Systems  Neurological:  Positive for dizziness.  All other systems reviewed and are negative.   Physical Exam Updated Vital Signs BP 124/71 (BP Location: Right Arm)   Pulse 90   Temp 98.7 F (37.1 C)   Resp 18   Ht 5\' 3"  (1.6 m)   Wt 108.9 kg   LMP 12/30/2022   SpO2 100%   BMI 42.51 kg/m  Physical Exam Vitals and nursing note reviewed.  Constitutional:      General: She is not in acute distress.    Appearance: She is well-developed.  HENT:     Head: Normocephalic and atraumatic.  Eyes:     Conjunctiva/sclera: Conjunctivae normal.  Cardiovascular:     Rate and Rhythm: Normal rate and regular rhythm.     Heart sounds: No murmur heard. Pulmonary:     Effort: Pulmonary effort is normal. No respiratory distress.     Breath sounds: Normal breath sounds. No wheezing, rhonchi or rales.  Abdominal:     Palpations: Abdomen is soft.     Tenderness: There is no abdominal tenderness. There is no guarding.  Musculoskeletal:        General: No swelling.     Cervical back: Neck supple.     Comments: Patient with tenderness to palpation of right anterior lateral malleolus but otherwise foot/ankle without tenderness to palpation.  Patient with full range  of motion bilateral hips, knees, ankles, digits.  Pedal and posterior tibial pulses 2+ bilaterally.  No overlying skin abnormalities appreciated.  Patient with tenderness palpation of the dorsal aspect of distal right ulna without tenderness to palpation of wrist/hand otherwise bilaterally.  Full range of motion of bilateral shoulders, elbows, wrist, digits.  Radial pulses 2+ bilaterally.  No midline tenderness of cervical, thoracic, lumbar spine with mildly step-off or deformity noted.  Paraspinal tenderness noted in the  lumbar region bilaterally.  No chest wall tenderness to palpation.  Skin:    General: Skin is warm and dry.     Capillary Refill: Capillary refill takes less than 2 seconds.  Neurological:     Mental Status: She is alert.     Comments: Alert and oriented to self, place, time and event.   Speech is fluent, clear without dysarthria or dysphasia.   Strength 5/5 in upper/lower extremities   Sensation intact in upper/lower extremities   Normal gait.  Negative Romberg. No pronator drift.  Normal finger-to-nose and feet tapping.  CN I not tested  CN II not tested CN III, IV, VI PERRLA and EOMs intact bilaterally  CN V Intact sensation to sharp and light touch to the face  CN VII facial movements symmetric  CN VIII not tested  CN IX, X no uvula deviation, symmetric rise of soft palate  CN XI 5/5 SCM and trapezius strength bilaterally  CN XII Midline tongue protrusion, symmetric L/R movements   Psychiatric:        Mood and Affect: Mood normal.     ED Results / Procedures / Treatments   Labs (all labs ordered are listed, but only abnormal results are displayed) Labs Reviewed  CBC WITH DIFFERENTIAL/PLATELET - Abnormal; Notable for the following components:      Result Value   Hemoglobin 10.8 (*)    HCT 34.9 (*)    MCV 75.4 (*)    MCH 23.3 (*)    All other components within normal limits  COMPREHENSIVE METABOLIC PANEL - Abnormal; Notable for the following components:   CO2 20 (*)    Glucose, Bld 119 (*)    Creatinine, Ser 1.01 (*)    Total Bilirubin 0.2 (*)    All other components within normal limits  PREGNANCY, URINE  URINALYSIS, ROUTINE W REFLEX MICROSCOPIC    EKG None  Radiology No results found.  Procedures Procedures  {Document cardiac monitor, telemetry assessment procedure when appropriate:1}  Medications Ordered in ED Medications  sodium chloride 0.9 % bolus 1,000 mL (has no administration in time range)  metoCLOPramide (REGLAN) injection 10 mg (has no  administration in time range)  diphenhydrAMINE (BENADRYL) injection 25 mg (has no administration in time range)    ED Course/ Medical Decision Making/ A&P   {   Click here for ABCD2, HEART and other calculatorsREFRESH Note before signing :1}                          Medical Decision Making Amount and/or Complexity of Data Reviewed Labs: ordered. Radiology: ordered.  Risk OTC drugs. Prescription drug management.   This patient presents to the ED for concern of generalized fatigue, body aches, lightheadedness, this involves an extensive number of treatment options, and is a complaint that carries with it a high risk of complications and morbidity.  The differential diagnosis includes CVA, orthostatic hypotension, anemia, electrolyte derangement, dehydration, arrhythmia   Co morbidities that complicate the patient evaluation  See HPI  Additional history obtained:  Additional history obtained from EMR External records from outside source obtained and reviewed including hospital records   Lab Tests:  I Ordered, and personally interpreted labs.  The pertinent results include: No leukocytosis.  Patient with evidence of anemia with a hemoglobin of 10.8 of which is decreased in patient's baseline around 13-14.  No platelet abnormalities.  Mild decrease in bicarb of 20 but otherwise without electrolyte abnormalities.  No transaminitis.  Patient with baseline creatinine of 1.01, BUN of 11 and GFR of greater than 60.  UA significant for 15 ketones and trace proteins otherwise unremarkable.  Urine pregnancy negative.  COVID-negative.  CK within normal limits.   Imaging Studies ordered:  I ordered imaging studies including chest x-ray, right ankle x-ray, right wrist x-ray I independently visualized and interpreted imaging which showed  Chest x-ray: No acute cardiopulmonary normalities Right ankle x-ray: Triangular lucency within dorsal aspect of navicular up to 5 mm and 2 mm in. Right  wrist x-ray: No acute abnormality. I agree with the radiologist interpretation   Cardiac Monitoring: / EKG:  The patient was maintained on a cardiac monitor.  I personally viewed and interpreted the cardiac monitored which showed an underlying rhythm of: Sinus rhythm   Consultations Obtained:  N/a   Problem List / ED Course / Critical interventions / Medication management  Myalgias, nausea, lightheadedness, I ordered medication including normal saline, Benadryl, Toradol, Reglan   Reevaluation of the patient after these medicines showed that the patient improved I have reviewed the patients home medicines and have made adjustments as needed   Social Determinants of Health:  Denies tobacco, illicit drug use   Test / Admission - Considered:  *** Vitals signs significant for ***. Otherwise within normal range and stable throughout visit. Laboratory/imaging studies significant for: *** *** Worrisome signs and symptoms were discussed with the patient, and the patient acknowledged understanding to return to the ED if noticed. Patient was stable upon discharge.    {Document critical care time when appropriate:1} {Document review of labs and clinical decision tools ie heart score, Chads2Vasc2 etc:1}  {Document your independent review of radiology images, and any outside records:1} {Document your discussion with family members, caretakers, and with consultants:1} {Document social determinants of health affecting pt's care:1} {Document your decision making why or why not admission, treatments were needed:1} Final Clinical Impression(s) / ED Diagnoses Final diagnoses:  None    Rx / DC Orders ED Discharge Orders     None

## 2023-01-05 NOTE — ED Notes (Signed)
Discharge paperwork given and verbally understood. 
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
## 2023-01-05 NOTE — Discharge Instructions (Addendum)
As discussed, workup today overall consistent with slightly worsening anemia from baseline.  Recommend use of over-the-counter iron supplementation as anemia is most likely secondary to bleeding from menstruation.  Also with evidence of possible fracture of your right foot as we discussed.  Recommend nonweightbearing until follow-up with orthopedics in the outpatient setting.  Recommend rest, ice, elevation of affected extremity at home as well as take medication in the form of Motrin/ibuprofen.  Call orthopedics as soon as you are able to set up an appointment.  Please do not hesitate to return to emergency department if the worrisome signs and symptoms we discussed become apparent.

## 2023-01-05 NOTE — ED Triage Notes (Signed)
Patient here POV from Home.  Endorses Nausea, Dizziness, Subjective Fever, Lack of Appetite, Fatigue for a few days. States today she became dizzy in the shower and fell. Injured Right Wrist and Right Ankle.   No Cough. No Emesis. No Diarrhea. Has been taking Doxycycline recently for "Breast Lumps"  NAD Noted during Triage. A&Ox4. GCS 15. BIB Wheelchair.

## 2023-01-05 NOTE — ED Notes (Signed)
Pt aware of the need for a urine... Unable to currently collect the urine... 

## 2023-01-05 NOTE — ED Notes (Signed)
Pt was able to ambulate, had pain to her right ankle (another reason she is at the hospital) but no SOB/CP/dizziness/faint... No trouble/pain urinating... No SOB/CP/faint/dizziness while performing orthostatic VS... Provider aware.Marland KitchenMarland Kitchen

## 2023-01-12 ENCOUNTER — Ambulatory Visit
Admission: RE | Admit: 2023-01-12 | Discharge: 2023-01-12 | Disposition: A | Discharge status: Home / Self Care | Payer: BC Managed Care – PPO | Source: Ambulatory Visit | Attending: Obstetrics and Gynecology | Admitting: Obstetrics and Gynecology

## 2023-01-12 ENCOUNTER — Other Ambulatory Visit: Payer: BC Managed Care – PPO

## 2023-01-12 DIAGNOSIS — N611 Abscess of the breast and nipple: Secondary | ICD-10-CM

## 2023-02-05 ENCOUNTER — Other Ambulatory Visit (HOSPITAL_COMMUNITY): Payer: Self-pay

## 2023-02-07 ENCOUNTER — Other Ambulatory Visit (HOSPITAL_COMMUNITY): Payer: Self-pay

## 2023-02-27 ENCOUNTER — Other Ambulatory Visit (HOSPITAL_BASED_OUTPATIENT_CLINIC_OR_DEPARTMENT_OTHER): Payer: Self-pay

## 2023-02-27 MED ORDER — MELOXICAM 15 MG PO TABS
15.0000 mg | ORAL_TABLET | Freq: Every day | ORAL | 0 refills | Status: DC
  Filled 2023-02-27: qty 30, 30d supply, fill #0

## 2023-03-12 ENCOUNTER — Other Ambulatory Visit (HOSPITAL_BASED_OUTPATIENT_CLINIC_OR_DEPARTMENT_OTHER): Payer: Self-pay

## 2023-03-21 ENCOUNTER — Other Ambulatory Visit (HOSPITAL_COMMUNITY): Payer: Self-pay

## 2023-04-22 ENCOUNTER — Emergency Department (HOSPITAL_BASED_OUTPATIENT_CLINIC_OR_DEPARTMENT_OTHER)
Admission: EM | Admit: 2023-04-22 | Discharge: 2023-04-22 | Disposition: A | Discharge status: Home / Self Care | Payer: BC Managed Care – PPO | Attending: Emergency Medicine | Admitting: Emergency Medicine

## 2023-04-22 ENCOUNTER — Other Ambulatory Visit: Payer: Self-pay

## 2023-04-22 ENCOUNTER — Emergency Department (HOSPITAL_COMMUNITY): Payer: BC Managed Care – PPO

## 2023-04-22 ENCOUNTER — Emergency Department (HOSPITAL_BASED_OUTPATIENT_CLINIC_OR_DEPARTMENT_OTHER): Payer: BC Managed Care – PPO

## 2023-04-22 DIAGNOSIS — R42 Dizziness and giddiness: Secondary | ICD-10-CM | POA: Insufficient documentation

## 2023-04-22 DIAGNOSIS — R519 Headache, unspecified: Secondary | ICD-10-CM

## 2023-04-22 DIAGNOSIS — H538 Other visual disturbances: Secondary | ICD-10-CM | POA: Diagnosis not present

## 2023-04-22 DIAGNOSIS — R791 Abnormal coagulation profile: Secondary | ICD-10-CM | POA: Diagnosis not present

## 2023-04-22 DIAGNOSIS — R200 Anesthesia of skin: Secondary | ICD-10-CM | POA: Diagnosis not present

## 2023-04-22 LAB — COMPREHENSIVE METABOLIC PANEL
ALT: 11 U/L (ref 0–44)
AST: 10 U/L — ABNORMAL LOW (ref 15–41)
Albumin: 4.6 g/dL (ref 3.5–5.0)
Alkaline Phosphatase: 47 U/L (ref 38–126)
Anion gap: 7 (ref 5–15)
BUN: 11 mg/dL (ref 6–20)
CO2: 29 mmol/L (ref 22–32)
Calcium: 10.4 mg/dL — ABNORMAL HIGH (ref 8.9–10.3)
Chloride: 101 mmol/L (ref 98–111)
Creatinine, Ser: 0.97 mg/dL (ref 0.44–1.00)
GFR, Estimated: >60 mL/min (ref >60)
Glucose, Bld: 99 mg/dL (ref 70–99)
Potassium: 4.2 mmol/L (ref 3.5–5.1)
Sodium: 137 mmol/L (ref 135–145)
Total Bilirubin: 0.5 mg/dL (ref 0.3–1.2)
Total Protein: 8.5 g/dL — ABNORMAL HIGH (ref 6.5–8.1)

## 2023-04-22 LAB — URINALYSIS, ROUTINE W REFLEX MICROSCOPIC
Bacteria, UA: NONE SEEN
Bilirubin Urine: NEGATIVE
Glucose, UA: NEGATIVE mg/dL
Ketones, ur: NEGATIVE mg/dL
Leukocytes,Ua: NEGATIVE
Nitrite: NEGATIVE
Protein, ur: NEGATIVE mg/dL
Specific Gravity, Urine: 1.01 (ref 1.005–1.030)
pH: 7.5 (ref 5.0–8.0)

## 2023-04-22 LAB — DIFFERENTIAL
Abs Immature Granulocytes: 0.03 10*3/uL (ref 0.00–0.07)
Basophils Absolute: 0.1 10*3/uL (ref 0.0–0.1)
Basophils Relative: 1 %
Eosinophils Absolute: 0.2 10*3/uL (ref 0.0–0.5)
Eosinophils Relative: 2 %
Immature Granulocytes: 0 %
Lymphocytes Relative: 36 %
Lymphs Abs: 4.2 10*3/uL — ABNORMAL HIGH (ref 0.7–4.0)
Monocytes Absolute: 1.1 10*3/uL — ABNORMAL HIGH (ref 0.1–1.0)
Monocytes Relative: 10 %
Neutro Abs: 6 10*3/uL (ref 1.7–7.7)
Neutrophils Relative %: 51 %

## 2023-04-22 LAB — CBC
HCT: 36 % (ref 36.0–46.0)
Hemoglobin: 11.6 g/dL — ABNORMAL LOW (ref 12.0–15.0)
MCH: 24.9 pg — ABNORMAL LOW (ref 26.0–34.0)
MCHC: 32.2 g/dL (ref 30.0–36.0)
MCV: 77.4 fL — ABNORMAL LOW (ref 80.0–100.0)
Platelets: 559 10*3/uL — ABNORMAL HIGH (ref 150–400)
RBC: 4.65 MIL/uL (ref 3.87–5.11)
RDW: 16.4 % — ABNORMAL HIGH (ref 11.5–15.5)
WBC: 11.6 10*3/uL — ABNORMAL HIGH (ref 4.0–10.5)
nRBC: 0 % (ref 0.0–0.2)

## 2023-04-22 LAB — APTT: aPTT: 26 s (ref 24–36)

## 2023-04-22 LAB — CBG MONITORING, ED: Glucose-Capillary: 81 mg/dL (ref 70–99)

## 2023-04-22 LAB — RAPID URINE DRUG SCREEN, HOSP PERFORMED
Amphetamines: NOT DETECTED
Barbiturates: NOT DETECTED
Benzodiazepines: NOT DETECTED
Cocaine: NOT DETECTED
Opiates: NOT DETECTED
Tetrahydrocannabinol: NOT DETECTED

## 2023-04-22 LAB — PROTIME-INR
INR: 0.9 (ref 0.8–1.2)
Prothrombin Time: 12 s (ref 11.4–15.2)

## 2023-04-22 LAB — PREGNANCY, URINE: Preg Test, Ur: NEGATIVE

## 2023-04-22 LAB — ETHANOL: Alcohol, Ethyl (B): <10 mg/dL (ref <10)

## 2023-04-22 MED ORDER — DIPHENHYDRAMINE HCL 50 MG/ML IJ SOLN
12.5000 mg | Freq: Once | INTRAMUSCULAR | Status: AC
  Administered 2023-04-22: 12.5 mg via INTRAVENOUS
  Filled 2023-04-22: qty 1

## 2023-04-22 MED ORDER — KETOROLAC TROMETHAMINE 30 MG/ML IJ SOLN
30.0000 mg | Freq: Once | INTRAMUSCULAR | Status: AC
  Administered 2023-04-22: 30 mg via INTRAVENOUS
  Filled 2023-04-22: qty 1

## 2023-04-22 MED ORDER — METOCLOPRAMIDE HCL 5 MG/ML IJ SOLN
5.0000 mg | Freq: Once | INTRAMUSCULAR | Status: AC
  Administered 2023-04-22: 5 mg via INTRAVENOUS
  Filled 2023-04-22: qty 2

## 2023-04-22 NOTE — ED Notes (Signed)
Called [facility] to transport patient to [facility] emergency--Dr. Denton Lank accepting

## 2023-04-22 NOTE — ED Notes (Addendum)
LKW 830 pm by sister. Awoke at 3 am with profound dizziness, weakness all extremities,had to "furniture walk" to the bathroom and reported blurred vision. Pt states this has been symptoms of her anemia in the past. Pt did have decreased sensation to left leg. Was not ataxic but extremely weak when lifting her arms to perform the test. Code stroke immediately called and taken to Ct. Neuro tele alerted as well.

## 2023-04-22 NOTE — ED Notes (Signed)
LKW 2030 last night
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
## 2023-04-22 NOTE — Discharge Instructions (Signed)
What increases the risk? The following factors may make you more likely to have migraine headaches: Being between the ages of 49-33 years old. Being female. Having a family history of migraine headaches. Being Caucasian. Having a mental health condition, such as depression or anxiety. Being obese.

## 2023-04-22 NOTE — ED Notes (Addendum)
Pt in Ct  

## 2023-04-22 NOTE — ED Notes (Signed)
Pt able to tolerate food.

## 2023-04-22 NOTE — Consult Note (Signed)
Code stroke activated at [phone].  Paged Dr Selina Cooley at [phone]. Pt taken to CT at this time.   Returned from CT at [phone].  Dr Selina Cooley on camera for neuro exam at [phone]. Plans to tx for MRI.   Off camera at [phone].  Derrek Monaco, telestroke RN

## 2023-04-22 NOTE — ED Provider Notes (Signed)
Patient sent for stroke rule out.  Awaiting MRI    I reviewed visualized and interpreted patient's MRI brain which shows no acute findings.  There is a focus in the front read by the radiologist that could be consistent with migraine headache.  On reevaluation of the patient patient states that she has had a headache all day.  I suspect that her symptoms are most likely due to complicated migraines symptoms.  Patient treated in the emergency department for migraine headache with Toradol Reglan and Benadryl.  She is feeling improved.  Will give the patient ambulatory referral to outpatient neurology for further workup of complicated migraine headache and abnormal neurologic symptoms.  She appears otherwise appropriate for discharge at this time.  Work note given.   Arthor Captain, PA-C 04/23/23 1829    Tegeler, Canary Brim, MD 04/23/23 Izell Vidette

## 2023-04-22 NOTE — ED Notes (Signed)
Patient arrives via [facility] from [place]. Report received from [facility]. Patient LKW 10/22 at 2030. Patient woke up at 0300 this morning with left arm numbness, dizziness, blurred vision, and headache. Code Stroke called at 0809, cancelled immediately. Patient has hx of anemia and hypertension, no known allergies. 20 in right forearm.

## 2023-04-22 NOTE — ED Provider Notes (Signed)
[place] EMERGENCY DEPARTMENT AT [facility] Provider Note   CSN: [id number] Arrival date & time: 04/22/23  0932  An emergency department physician performed an initial assessment on this suspected stroke patient at 0824.  History  Chief Complaint  Patient presents with   Code Stroke    Michele Pennington is a 32 y.o. female.  Patient presents after she awoke with left arm numbness and dizziness.  Patient had mild blurry vision both eyes.  No history of similar.  Patient was able to walk but was dizzy.  No history of known stroke, no family history of strokes at a young age.  Patient is not on blood thinners.  History of anemia.  Dizziness intermittent and component of positional.  No history of complex migraines.  Currently symptoms minimal only left arm numbness.  The history is provided by the patient.       Home Medications Prior to Admission medications   Medication Sig Start Date End Date Taking? Authorizing Provider  acetaminophen (TYLENOL) 500 MG tablet Take 1,000 mg by mouth every 6 (six) hours as needed for mild pain, moderate pain or fever.    [provider]  Chlorpheniramine-Acetaminophen (CORICIDIN HBP COLD/FLU PO)  11/25/21   [provider]  doxycycline (VIBRAMYCIN) 100 MG capsule Take 100 mg by mouth 2 (two) times daily. 12/25/22   [provider]  ferrous sulfate 325 (65 FE) MG tablet Take 1 tablet (325 mg total) by mouth daily. 01/05/23   Peter Garter, PA  ibuprofen (ADVIL) 600 MG tablet Take 1 tablet (600 mg total) by mouth every 6 (six) hours as needed. 01/05/23   Peter Garter, PA  meloxicam (MOBIC) 15 MG tablet Take 1 tablet (15 mg total) by mouth daily as needed for pain. 02/27/23         Allergies    Patient has no known allergies.    Review of Systems   Review of Systems  Constitutional:  Negative for chills and fever.  HENT:  Negative for congestion.   Eyes:  Positive for visual disturbance.   Respiratory:  Negative for shortness of breath.   Cardiovascular:  Negative for chest pain.  Gastrointestinal:  Negative for abdominal pain and vomiting.  Genitourinary:  Negative for dysuria and flank pain.  Musculoskeletal:  Negative for back pain, neck pain and neck stiffness.  Skin:  Negative for rash.  Neurological:  Positive for dizziness and headaches. Negative for light-headedness.    Physical Exam Updated Vital Signs BP 128/80   Pulse 75   Temp 99.3 F (37.4 C) (Oral)   Resp 11   SpO2 98%  Physical Exam Vitals and nursing note reviewed.  Constitutional:      General: She is not in acute distress.    Appearance: She is well-developed.  HENT:     Head: Normocephalic and atraumatic.     Mouth/Throat:     Mouth: Mucous membranes are moist.  Eyes:     General:        Right eye: No discharge.        Left eye: No discharge.     Conjunctiva/sclera: Conjunctivae normal.  Neck:     Trachea: No tracheal deviation.  Cardiovascular:     Rate and Rhythm: Normal rate and regular rhythm.     Heart sounds: No murmur heard. Pulmonary:     Effort: Pulmonary effort is normal.     Breath sounds: Normal breath sounds.  Abdominal:     General: There  is no distension.     Palpations: Abdomen is soft.     Tenderness: There is no abdominal tenderness. There is no guarding.  Musculoskeletal:        General: No swelling.     Cervical back: Normal range of motion and neck supple. No rigidity.  Skin:    General: Skin is warm.     Capillary Refill: Capillary refill takes less than 2 seconds.     Findings: No rash.     Comments: Patient has subjective decrease sensation left arm versus right.  No arm or leg drift bilateral.  Neurological:     General: No focal deficit present.     Mental Status: She is alert.     Cranial Nerves: No cranial nerve deficit.     Sensory: No sensory deficit.     Motor: No weakness.     Coordination: Coordination normal.  Psychiatric:        Mood and  Affect: Mood normal.     ED Results / Procedures / Treatments   Labs (all labs ordered are listed, but only abnormal results are displayed) Labs Reviewed  CBC - Abnormal; Notable for the following components:      Result Value   WBC 11.6 (*)    Hemoglobin 11.6 (*)    MCV 77.4 (*)    MCH 24.9 (*)    RDW 16.4 (*)    Platelets 559 (*)    All other components within normal limits  DIFFERENTIAL - Abnormal; Notable for the following components:   Lymphs Abs 4.2 (*)    Monocytes Absolute 1.1 (*)    All other components within normal limits  COMPREHENSIVE METABOLIC PANEL - Abnormal; Notable for the following components:   Calcium 10.4 (*)    Total Protein 8.5 (*)    AST 10 (*)    All other components within normal limits  URINALYSIS, ROUTINE W REFLEX MICROSCOPIC - Abnormal; Notable for the following components:   Hgb urine dipstick SMALL (*)    All other components within normal limits  ETHANOL  PROTIME-INR  APTT  RAPID URINE DRUG SCREEN, HOSP PERFORMED  PREGNANCY, URINE  CBG MONITORING, ED    EKG None  Radiology CT HEAD CODE STROKE WO CONTRAST  Result Date: 04/22/2023 CLINICAL DATA:  Code stroke. Neuro deficit, acute, stroke suspected. Left-sided weakness. History of anemia. EXAM: CT HEAD WITHOUT CONTRAST TECHNIQUE: Contiguous axial images were obtained from the base of the skull through the vertex without intravenous contrast. RADIATION DOSE REDUCTION: This exam was performed according to the departmental dose-optimization program which includes automated exposure control, adjustment of the mA and/or kV according to patient size and/or use of iterative reconstruction technique. COMPARISON:  None Available. FINDINGS: Brain: The brain has normal appearance without evidence of malformation, atrophy, old or acute infarction, mass lesion, hemorrhage, hydrocephalus or extra-axial collection. Vascular: No abnormal vascular finding. Skull: Normal Sinuses/Orbits: Clear/normal Other: None  ASPECTS (Alberta Stroke Program Early CT Score) - Ganglionic level infarction (caudate, lentiform nuclei, internal capsule, insula, M1-M3 cortex): 7 - Supraganglionic infarction (M4-M6 cortex): 3 Total score (0-10 with 10 being normal): 10 IMPRESSION: 1. Normal head CT. 2. Aspects is 10. 3. These results were called by telephone at the time of interpretation on 04/22/2023 at 8:22 am to provider [facility] , who verbally acknowledged these results. Electronically Signed   By: Paulina Fusi M.D.   On: 04/22/2023 08:23    Procedures .Critical Care  Performed by: Blane Ohara, MD Authorized by: Jodi Mourning,  Ivin Booty, MD   Critical care provider statement:    Critical care time (minutes):  30   Critical care start time:  04/22/2023 9:00 AM   Critical care end time:  04/22/2023 9:30 AM   Critical care time was exclusive of:  Separately billable procedures and treating other patients and teaching time   Critical care was necessary to treat or prevent imminent or life-threatening deterioration of the following conditions:  CNS failure or compromise   Critical care was time spent personally by me on the following activities:  Pulse oximetry, discussions with consultants, evaluation of patient's response to treatment, ordering and review of laboratory studies and ordering and review of radiographic studies     Medications Ordered in ED Medications - No data to display  ED Course/ Medical Decision Making/ A&P                                 Medical Decision Making Amount and/or Complexity of Data Reviewed Labs: ordered. Radiology: ordered.  Risk Prescription drug management.   Patient presents with new onset dizziness and left arm numbness.  Last known normal was approximately 8:30 PM yesterday evening.  Patient currently has minimal signs and symptoms, NIH of 1.  Code stroke was called and patient was assessed by neurology as well as myself shortly after CT scan.  Discussed with radiology CT  scan read immediately and no signs of significant stroke or bleeding.  Patient's blood pressure 150 systolic we will monitor this.  Afebrile no infectious signs or symptoms.  Discussed differential including BPPV, anemia related, occult stroke, complex migraine, other.  Blood work independently reviewed overall unremarkable hemoglobin stable minimal anemia of 11, electrolytes unremarkable normal glucose.  Neurology recommended transfer to [facility] for MRI to ensure no occult stroke.  No indication for tPA at this time.  Discussed this with patient and is comfortable plan.  Reassessment patient doing well no worsening signs or symptoms.  Contacted Dr. Denton Lank at [facility], ER to transport for MRI.        Final Clinical Impression(s) / ED Diagnoses Final diagnoses:  Left arm numbness  Dizziness    Rx / DC Orders ED Discharge Orders     None         Blane Ohara, MD 05/02/23 2328

## 2023-04-22 NOTE — ED Notes (Signed)
Patient transported to MRI 

## 2023-04-22 NOTE — ED Provider Notes (Signed)
This is an overall well-appearing 32 year old female with past medical history significant for hypertension, previous syncopal episode which led to foot fracture who presents with concern for waking up this morning with dizziness, difficulty with vision, as well as left-sided arm numbness/tingling.  She was sent over from [facility] as a code stroke.  Normal initial head CT and unremarkable lab work other than mild leukocytosis, elevated platelets at 559 may be acute phase reactant.  She is pending an MR brain.  She reports her symptoms have overall resolved other than some mild tingling at the fingertips.   Pending MR brain at this time.  3:12 PM Care of Michele Pennington transferred to [facility] Ar[facility] Captain and Dr. Rush Landmark at the end of my shift as the patient will require reassessment once labs/imaging have resulted. Patient presentation, ED course, and plan of care discussed with review of all pertinent labs and imaging. Please see his/her note for further details regarding further ED course and disposition. Plan at time of handoff is reassess after MRI, if still having any neurosymptoms may benefit from neuro evaluations for possible TIA versus symptoms may be nonspecific enough on initial presentation she is okay to follow-up outpatient for neurology. This may be altered or completely changed at the discretion of the oncoming team pending results of further workup.    Michele Pennington 04/22/23 1512    Michele Laine, MD 04/27/23 1857

## 2023-04-22 NOTE — ED Notes (Signed)
Patient transported to CT 

## 2023-04-22 NOTE — ED Notes (Addendum)
Triage started at 0806, code stroke called 0809

## 2023-04-28 ENCOUNTER — Encounter (HOSPITAL_BASED_OUTPATIENT_CLINIC_OR_DEPARTMENT_OTHER): Payer: Self-pay | Admitting: Family Medicine

## 2023-04-28 ENCOUNTER — Ambulatory Visit (INDEPENDENT_AMBULATORY_CARE_PROVIDER_SITE_OTHER): Payer: BC Managed Care – PPO | Admitting: Family Medicine

## 2023-04-28 ENCOUNTER — Other Ambulatory Visit (HOSPITAL_BASED_OUTPATIENT_CLINIC_OR_DEPARTMENT_OTHER): Payer: Self-pay

## 2023-04-28 VITALS — BP 132/88 | HR 86 | Ht 63.0 in | Wt 240.0 lb

## 2023-04-28 DIAGNOSIS — N926 Irregular menstruation, unspecified: Secondary | ICD-10-CM

## 2023-04-28 DIAGNOSIS — O99013 Anemia complicating pregnancy, third trimester: Secondary | ICD-10-CM

## 2023-04-28 DIAGNOSIS — G43E19 Chronic migraine with aura, intractable, without status migrainosus: Secondary | ICD-10-CM | POA: Diagnosis not present

## 2023-04-28 DIAGNOSIS — F411 Generalized anxiety disorder: Secondary | ICD-10-CM | POA: Diagnosis not present

## 2023-04-28 DIAGNOSIS — F331 Major depressive disorder, recurrent, moderate: Secondary | ICD-10-CM | POA: Insufficient documentation

## 2023-04-28 HISTORY — DX: Anemia complicating pregnancy, third trimester: O99.013

## 2023-04-28 HISTORY — DX: Irregular menstruation, unspecified: N92.6

## 2023-04-28 MED ORDER — BUTALBITAL-APAP-CAFFEINE 50-325-40 MG PO TABS
1.0000 | ORAL_TABLET | Freq: Four times a day (QID) | ORAL | 3 refills | Status: DC | PRN
  Filled 2023-04-28: qty 14, 4d supply, fill #0

## 2023-04-28 MED ORDER — FLUOXETINE HCL 10 MG PO CAPS
10.0000 mg | ORAL_CAPSULE | Freq: Every day | ORAL | 3 refills | Status: DC
  Filled 2023-04-28: qty 30, 30d supply, fill #0
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
## 2023-04-30 ENCOUNTER — Other Ambulatory Visit (HOSPITAL_BASED_OUTPATIENT_CLINIC_OR_DEPARTMENT_OTHER): Payer: Self-pay

## 2023-05-01 ENCOUNTER — Other Ambulatory Visit (HOSPITAL_BASED_OUTPATIENT_CLINIC_OR_DEPARTMENT_OTHER): Payer: Self-pay

## 2023-05-01 ENCOUNTER — Ambulatory Visit: Payer: BC Managed Care – PPO | Admitting: Neurology

## 2023-05-01 ENCOUNTER — Encounter: Payer: Self-pay | Admitting: Neurology

## 2023-05-01 VITALS — BP 146/98 | HR 92 | Resp 16 | Ht 63.0 in | Wt 238.1 lb

## 2023-05-01 DIAGNOSIS — G43709 Chronic migraine without aura, not intractable, without status migrainosus: Secondary | ICD-10-CM | POA: Diagnosis not present

## 2023-05-01 DIAGNOSIS — F419 Anxiety disorder, unspecified: Secondary | ICD-10-CM

## 2023-05-01 DIAGNOSIS — R030 Elevated blood-pressure reading, without diagnosis of hypertension: Secondary | ICD-10-CM

## 2023-05-01 MED ORDER — PROPRANOLOL HCL ER 60 MG PO CP24
60.0000 mg | ORAL_CAPSULE | Freq: Every day | ORAL | 6 refills | Status: DC
  Filled 2023-05-01: qty 30, 30d supply, fill #0

## 2023-05-01 MED ORDER — SUMATRIPTAN SUCCINATE 50 MG PO TABS
50.0000 mg | ORAL_TABLET | ORAL | 6 refills | Status: DC | PRN
  Filled 2023-05-01: qty 9, 30d supply, fill #0
  Filled 2023-05-12: qty 9, 4d supply, fill #0
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
## 2023-05-01 NOTE — Patient Instructions (Signed)
Start medications as directed including Fluoxetine 10 mg daily  Start Propanolol ER 60 mg daily  Sumatriptan as needed for headaches  Continue to follow with PCP, consider Iron studies  Return in 6 months or sooner if worse

## 2023-05-04 ENCOUNTER — Ambulatory Visit: Payer: BC Managed Care – PPO | Admitting: Neurology

## 2023-05-07 DIAGNOSIS — Z0279 Encounter for issue of other medical certificate: Secondary | ICD-10-CM

## 2023-05-11 ENCOUNTER — Other Ambulatory Visit (HOSPITAL_BASED_OUTPATIENT_CLINIC_OR_DEPARTMENT_OTHER): Payer: Self-pay

## 2023-05-12 ENCOUNTER — Other Ambulatory Visit (HOSPITAL_BASED_OUTPATIENT_CLINIC_OR_DEPARTMENT_OTHER): Payer: Self-pay

## 2023-05-12 MED ORDER — FLULAVAL 0.5 ML IM SUSY
0.5000 mL | PREFILLED_SYRINGE | Freq: Once | INTRAMUSCULAR | 0 refills | Status: AC
  Filled 2023-05-12: qty 0.5, 1d supply, fill #0

## 2023-05-27 ENCOUNTER — Ambulatory Visit (HOSPITAL_BASED_OUTPATIENT_CLINIC_OR_DEPARTMENT_OTHER): Payer: BC Managed Care – PPO | Admitting: Family Medicine

## 2023-06-03 ENCOUNTER — Ambulatory Visit (INDEPENDENT_AMBULATORY_CARE_PROVIDER_SITE_OTHER): Payer: BC Managed Care – PPO | Admitting: Family Medicine

## 2023-06-03 ENCOUNTER — Encounter (HOSPITAL_BASED_OUTPATIENT_CLINIC_OR_DEPARTMENT_OTHER): Payer: Self-pay | Admitting: *Deleted

## 2023-06-03 ENCOUNTER — Other Ambulatory Visit (HOSPITAL_BASED_OUTPATIENT_CLINIC_OR_DEPARTMENT_OTHER): Payer: Self-pay

## 2023-06-03 ENCOUNTER — Encounter (HOSPITAL_BASED_OUTPATIENT_CLINIC_OR_DEPARTMENT_OTHER): Payer: Self-pay | Admitting: Family Medicine

## 2023-06-03 VITALS — BP 138/88 | HR 76 | Ht 63.0 in | Wt 240.0 lb

## 2023-06-03 DIAGNOSIS — F411 Generalized anxiety disorder: Secondary | ICD-10-CM | POA: Diagnosis not present

## 2023-06-03 DIAGNOSIS — F331 Major depressive disorder, recurrent, moderate: Secondary | ICD-10-CM

## 2023-06-03 DIAGNOSIS — G43E19 Chronic migraine with aura, intractable, without status migrainosus: Secondary | ICD-10-CM

## 2023-06-03 DIAGNOSIS — Z86018 Personal history of other benign neoplasm: Secondary | ICD-10-CM

## 2023-06-03 MED ORDER — FLUOXETINE HCL 10 MG PO CAPS
10.0000 mg | ORAL_CAPSULE | Freq: Every day | ORAL | 3 refills | Status: DC
  Filled 2023-06-03: qty 90, 90d supply, fill #0
  Filled 2023-09-10: qty 90, 90d supply, fill #1
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
## 2023-06-03 NOTE — Patient Instructions (Signed)
Please monitor your blood pressure at least twice weekly and keep a log. If BP is over 130/80 consistently, please reach out to the office. Take your BP at least 1 hour after taking your propanolol.

## 2023-06-05 ENCOUNTER — Encounter (HOSPITAL_BASED_OUTPATIENT_CLINIC_OR_DEPARTMENT_OTHER): Payer: Self-pay | Admitting: Family Medicine

## 2023-06-05 NOTE — Progress Notes (Signed)
FMLA paperwork extended for temporary continuous leave from 06/05/23 to 07/05/22 and letter provided as well. Faxed to [employer].

## 2023-07-02 ENCOUNTER — Encounter (HOSPITAL_BASED_OUTPATIENT_CLINIC_OR_DEPARTMENT_OTHER): Payer: Self-pay | Admitting: Family Medicine

## 2023-07-02 ENCOUNTER — Other Ambulatory Visit (HOSPITAL_BASED_OUTPATIENT_CLINIC_OR_DEPARTMENT_OTHER): Payer: Self-pay

## 2023-07-02 ENCOUNTER — Ambulatory Visit (INDEPENDENT_AMBULATORY_CARE_PROVIDER_SITE_OTHER): Payer: No Typology Code available for payment source | Admitting: Family Medicine

## 2023-07-02 VITALS — BP 133/88 | HR 65 | Ht 63.0 in | Wt 254.0 lb

## 2023-07-02 DIAGNOSIS — F331 Major depressive disorder, recurrent, moderate: Secondary | ICD-10-CM

## 2023-07-02 DIAGNOSIS — F411 Generalized anxiety disorder: Secondary | ICD-10-CM

## 2023-07-02 DIAGNOSIS — D500 Iron deficiency anemia secondary to blood loss (chronic): Secondary | ICD-10-CM

## 2023-07-02 DIAGNOSIS — G43E19 Chronic migraine with aura, intractable, without status migrainosus: Secondary | ICD-10-CM | POA: Diagnosis not present

## 2023-07-02 DIAGNOSIS — R5382 Chronic fatigue, unspecified: Secondary | ICD-10-CM | POA: Insufficient documentation

## 2023-07-02 MED ORDER — FERROUS SULFATE 325 (65 FE) MG PO TABS
325.0000 mg | ORAL_TABLET | Freq: Every day | ORAL | 3 refills | Status: DC
  Filled 2023-07-02: qty 100, 100d supply, fill #0
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
## 2023-07-03 LAB — CBC WITH DIFFERENTIAL/PLATELET
Basophils Absolute: 0.1 10*3/uL (ref 0.0–0.2)
Basos: 1 %
EOS (ABSOLUTE): 0.2 10*3/uL (ref 0.0–0.4)
Eos: 3 %
Hematocrit: 32.6 % — ABNORMAL LOW (ref 34.0–46.6)
Hemoglobin: 10 g/dL — ABNORMAL LOW (ref 11.1–15.9)
Immature Grans (Abs): 0 10*3/uL (ref 0.0–0.1)
Immature Granulocytes: 0 %
Lymphocytes Absolute: 2.3 10*3/uL (ref 0.7–3.1)
Lymphs: 37 %
MCH: 24.3 pg — ABNORMAL LOW (ref 26.6–33.0)
MCHC: 30.7 g/dL — ABNORMAL LOW (ref 31.5–35.7)
MCV: 79 fL (ref 79–97)
Monocytes Absolute: 0.8 10*3/uL (ref 0.1–0.9)
Monocytes: 13 %
Neutrophils Absolute: 2.8 10*3/uL (ref 1.4–7.0)
Neutrophils: 46 %
Platelets: 387 10*3/uL (ref 150–450)
RBC: 4.11 x10E6/uL (ref 3.77–5.28)
RDW: 16.7 % — ABNORMAL HIGH (ref 11.7–15.4)
WBC: 6.2 10*3/uL (ref 3.4–10.8)

## 2023-07-03 LAB — VITAMIN B12: Vitamin B-12: 349 pg/mL (ref 232–1245)

## 2023-07-03 LAB — IRON,TIBC AND FERRITIN PANEL
Ferritin: 25 ng/mL (ref 15–150)
Iron Saturation: 82 % (ref 15–55)
Iron: 278 ug/dL (ref 27–159)
Total Iron Binding Capacity: 339 ug/dL (ref 250–450)
UIBC: 61 ug/dL — ABNORMAL LOW (ref 131–425)

## 2023-07-03 LAB — VITAMIN D 25 HYDROXY (VIT D DEFICIENCY, FRACTURES): Vit D, 25-Hydroxy: 18.9 ng/mL — ABNORMAL LOW (ref 30.0–100.0)

## 2023-07-05 ENCOUNTER — Encounter (HOSPITAL_BASED_OUTPATIENT_CLINIC_OR_DEPARTMENT_OTHER): Payer: Self-pay | Admitting: Family Medicine

## 2023-07-07 ENCOUNTER — Encounter (HOSPITAL_BASED_OUTPATIENT_CLINIC_OR_DEPARTMENT_OTHER): Payer: Self-pay | Admitting: Family Medicine

## 2023-07-07 ENCOUNTER — Other Ambulatory Visit (HOSPITAL_BASED_OUTPATIENT_CLINIC_OR_DEPARTMENT_OTHER): Payer: Self-pay

## 2023-07-07 DIAGNOSIS — E559 Vitamin D deficiency, unspecified: Secondary | ICD-10-CM | POA: Insufficient documentation

## 2023-07-07 MED ORDER — CHOLECALCIFEROL 1.25 MG (50000 UT) PO CAPS
50000.0000 [IU] | ORAL_CAPSULE | ORAL | 0 refills | Status: DC
  Filled 2023-07-07: qty 12, 84d supply, fill #0

## 2023-07-07 NOTE — Progress Notes (Signed)
 Hi Michele Pennington,  Looking at your labs, it looks like your iron levels are now becoming too high and we may need to decrease your iron supplement or change the dosing schedule for you. How often are you taking your iron supplement? Although your iron is high, your hemoglobin and hematocrit are not improving and you chronically are around the 10-11 range. Did you mention that your family member required iron transfusion in the past? Hematology would be able to see you and possibly recommend iron transfusion replacement if required. Additionally, if we were to try birth control instead this may correct the problem of chronic blood loss due to your periods. Let me know what you would prefer.

## 2023-07-13 ENCOUNTER — Other Ambulatory Visit (HOSPITAL_BASED_OUTPATIENT_CLINIC_OR_DEPARTMENT_OTHER): Payer: Self-pay

## 2023-07-14 ENCOUNTER — Other Ambulatory Visit (HOSPITAL_BASED_OUTPATIENT_CLINIC_OR_DEPARTMENT_OTHER): Payer: Self-pay | Admitting: Family Medicine

## 2023-07-21 ENCOUNTER — Ambulatory Visit (INDEPENDENT_AMBULATORY_CARE_PROVIDER_SITE_OTHER): Payer: No Typology Code available for payment source | Admitting: *Deleted

## 2023-07-21 VITALS — BP 159/92 | HR 78 | Temp 98.2°F | Ht 64.0 in | Wt 251.5 lb

## 2023-07-21 DIAGNOSIS — Z30011 Encounter for initial prescription of contraceptive pills: Secondary | ICD-10-CM

## 2023-07-21 LAB — POCT URINE PREGNANCY: Preg Test, Ur: NEGATIVE

## 2023-07-21 NOTE — Progress Notes (Unsigned)
Pt. Was here for a urine pregnancy test to be put back on birth control. Results documented. Pt. Provider is aware.

## 2023-07-22 ENCOUNTER — Other Ambulatory Visit (HOSPITAL_BASED_OUTPATIENT_CLINIC_OR_DEPARTMENT_OTHER): Payer: Self-pay

## 2023-07-22 ENCOUNTER — Encounter (HOSPITAL_BASED_OUTPATIENT_CLINIC_OR_DEPARTMENT_OTHER): Payer: Self-pay | Admitting: Certified Nurse Midwife

## 2023-07-22 ENCOUNTER — Encounter (HOSPITAL_BASED_OUTPATIENT_CLINIC_OR_DEPARTMENT_OTHER): Payer: Self-pay | Admitting: Family Medicine

## 2023-07-22 ENCOUNTER — Other Ambulatory Visit (HOSPITAL_BASED_OUTPATIENT_CLINIC_OR_DEPARTMENT_OTHER): Payer: Self-pay | Admitting: Family Medicine

## 2023-07-22 DIAGNOSIS — Z30011 Encounter for initial prescription of contraceptive pills: Secondary | ICD-10-CM

## 2023-07-22 DIAGNOSIS — N920 Excessive and frequent menstruation with regular cycle: Secondary | ICD-10-CM

## 2023-07-22 MED ORDER — NORETHIN ACE-ETH ESTRAD-FE 1-20 MG-MCG PO TABS
1.0000 | ORAL_TABLET | Freq: Every day | ORAL | 1 refills | Status: DC
  Filled 2023-07-22: qty 84, 84d supply, fill #0

## 2023-07-22 NOTE — Progress Notes (Signed)
Hi Michele Pennington,  I have sent in your birth control called Junel FE. This does have iron in it, so I would decrease your iron supplement to only twice a week at this time. We will check your blood pressure and make sure you are doing well on the birth control at your appt in March. If you begin to experience headaches, dizziness, please check your blood pressure and if high, call the office. Hopefully we can correct your iron deficiency with this.

## 2023-07-29 ENCOUNTER — Encounter (HOSPITAL_BASED_OUTPATIENT_CLINIC_OR_DEPARTMENT_OTHER): Payer: Self-pay | Admitting: Certified Nurse Midwife

## 2023-07-29 ENCOUNTER — Ambulatory Visit (HOSPITAL_BASED_OUTPATIENT_CLINIC_OR_DEPARTMENT_OTHER): Payer: 59 | Admitting: Certified Nurse Midwife

## 2023-07-29 ENCOUNTER — Other Ambulatory Visit (HOSPITAL_BASED_OUTPATIENT_CLINIC_OR_DEPARTMENT_OTHER): Payer: Self-pay

## 2023-07-29 VITALS — BP 143/85 | HR 71 | Ht 64.0 in | Wt 252.0 lb

## 2023-07-29 DIAGNOSIS — Z01419 Encounter for gynecological examination (general) (routine) without abnormal findings: Secondary | ICD-10-CM

## 2023-07-29 DIAGNOSIS — N939 Abnormal uterine and vaginal bleeding, unspecified: Secondary | ICD-10-CM | POA: Diagnosis not present

## 2023-07-29 DIAGNOSIS — D509 Iron deficiency anemia, unspecified: Secondary | ICD-10-CM

## 2023-07-29 MED ORDER — NORETHINDRONE ACETATE 5 MG PO TABS
5.0000 mg | ORAL_TABLET | Freq: Three times a day (TID) | ORAL | 3 refills | Status: DC
  Filled 2023-07-29: qty 60, 30d supply, fill #0
  Filled 2023-08-24: qty 60, 20d supply, fill #0
  Filled 2023-09-28: qty 60, 20d supply, fill #1
  Filled 2023-11-04: qty 60, 20d supply, fill #2

## 2023-07-29 NOTE — Progress Notes (Unsigned)
  Son is 7. 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  Had Mirena for 5 years after birth of her son and did not have any vaginal spotting or bleeding during this time. IUD expired, was removed, new IUD inserted. She had the IUD for 1 year and bled every day. IUD removed January 2024 and pt continued to bleed "most days", sometimes "heavy", sometimes passing large clots in shower, sometimes spotting.  She is not sexually active. Hx fibroids   RTO [place], possible Mirena IUD insertion Aygestin TID until bleeding stops then daily

## 2023-07-30 ENCOUNTER — Ambulatory Visit: Payer: BC Managed Care – PPO | Admitting: Neurology

## 2023-08-03 ENCOUNTER — Other Ambulatory Visit (HOSPITAL_BASED_OUTPATIENT_CLINIC_OR_DEPARTMENT_OTHER): Payer: Self-pay

## 2023-08-12 ENCOUNTER — Ambulatory Visit (HOSPITAL_BASED_OUTPATIENT_CLINIC_OR_DEPARTMENT_OTHER): Payer: 59 | Admitting: Certified Nurse Midwife

## 2023-08-12 ENCOUNTER — Encounter (HOSPITAL_BASED_OUTPATIENT_CLINIC_OR_DEPARTMENT_OTHER): Payer: Self-pay

## 2023-08-12 ENCOUNTER — Ambulatory Visit (HOSPITAL_BASED_OUTPATIENT_CLINIC_OR_DEPARTMENT_OTHER): Payer: 59

## 2023-08-12 DIAGNOSIS — N939 Abnormal uterine and vaginal bleeding, unspecified: Secondary | ICD-10-CM | POA: Diagnosis not present

## 2023-08-13 ENCOUNTER — Encounter (HOSPITAL_BASED_OUTPATIENT_CLINIC_OR_DEPARTMENT_OTHER): Payer: Self-pay | Admitting: Certified Nurse Midwife

## 2023-08-13 NOTE — Progress Notes (Deleted)
  [place] (08/12/23): Pelvic structures were imaged in sagittal and transverse orientation transvaginally.  Anteverted uterus appears enlarged in size with a couple small intramural fibroids and 1 large fibroid Intramural appears to be pushing against the cavity Fibroid 1 1.86 x 1.65cm Fibroid 2 5.27 x 5.55cm EM 1.7cm Ovaries appear normal bilaterally Neg cul de sac. No free fluid present. CXL wnl.

## 2023-08-14 ENCOUNTER — Telehealth (HOSPITAL_BASED_OUTPATIENT_CLINIC_OR_DEPARTMENT_OTHER): Payer: 59 | Admitting: Certified Nurse Midwife

## 2023-08-24 ENCOUNTER — Other Ambulatory Visit (HOSPITAL_BASED_OUTPATIENT_CLINIC_OR_DEPARTMENT_OTHER): Payer: Self-pay

## 2023-08-25 ENCOUNTER — Encounter (HOSPITAL_BASED_OUTPATIENT_CLINIC_OR_DEPARTMENT_OTHER): Payer: Self-pay | Admitting: Certified Nurse Midwife

## 2023-08-25 ENCOUNTER — Ambulatory Visit (HOSPITAL_BASED_OUTPATIENT_CLINIC_OR_DEPARTMENT_OTHER): Payer: 59 | Admitting: Certified Nurse Midwife

## 2023-08-25 VITALS — BP 141/86 | HR 70 | Ht 64.0 in | Wt 254.3 lb

## 2023-08-25 DIAGNOSIS — N921 Excessive and frequent menstruation with irregular cycle: Secondary | ICD-10-CM

## 2023-08-25 DIAGNOSIS — D259 Leiomyoma of uterus, unspecified: Secondary | ICD-10-CM

## 2023-08-25 NOTE — Progress Notes (Unsigned)
   Subjective:     Meridith Romick is a 33 y.o. female G2P1011 here for follow-up after GYN [place] (08/12/23). Pt was initially evaluated on 07/29/23. At that time, pt reported menorrhagia w/ anemia. Her son is 7yo (Primary CS). Pt reports that she had a Mirena for 5 years after his birth. She was amenorrheic during that time.Mirena was removed after 5 years and new Mirena IUD was placed. She did not experience amenorrhea with 2nd Mirena. States she experienced irregular spotting and bleeding with 2nd Mirena for 1 year before she requested removal. Pt was brought back to office 08/12/23 for [place]. Findings: Uterus: 11.6 x 9.1 x 9.1cm with 1.8 x 1.6cm fibroid and 5.2 x 5.5cm fibroid.  Larger fibroid appears to be pushing against endometrial cavity.  Volume: 511.27ml. Endometrial thickness:  1.7cm, echogenic. Right ovary:  4.1 x 3.4 x 2.7cm.  Volume:  20.73ml.  Left ovary:  2.7 x 3.2 x 2.9cm.  Volume:  13.33ml. Other findings:  No abnormal free fluid.  Review of Systems Pertinent items are noted in HPI.    Objective:    BP (!) 141/86 (BP Location: Right Arm, Patient Position: Sitting, Cuff Size: Large)   Pulse 70   Ht 5\' 4"  (1.626 m)   Wt 254 lb 4.8 oz (115.3 kg)   BMI 43.65 kg/m  General appearance: alert, cooperative, and appears stated age    Assessment:    Large Uterine Fibroid .   Menorrhagia Anemia (likely secondary menorrhagia)  Plan:    US findings reviewed w/ patient. We discussed potential options for management including availability Depo Provera, Nexplanon, Mirena. If Mirena IUD desired, Dr. Hyacinth Meeker would recommend insertion of Mirena IUD under US guidance. Pt also aware of availabiltiy of Sonohysterogram to further evaluate endometrial canal (rule out polyp). Discussed surgical options also available, although some options would likely necessitate a Repeat LTCS if she desires future childbearing. Pt would like to RTO to discuss surgical options with Dr. Leda Quail. She is  potentially interested in [place].  Pt inquired about Myosure but is not a good candidate for Myosure given size/location of 5cm uterine fibroid. She will RTO for Pre-Op discussion with Dr. Hyacinth Meeker next available.  Letta Kocher

## 2023-08-28 DIAGNOSIS — D259 Leiomyoma of uterus, unspecified: Secondary | ICD-10-CM | POA: Insufficient documentation

## 2023-08-28 DIAGNOSIS — N921 Excessive and frequent menstruation with irregular cycle: Secondary | ICD-10-CM | POA: Insufficient documentation

## 2023-09-01 ENCOUNTER — Encounter (HOSPITAL_BASED_OUTPATIENT_CLINIC_OR_DEPARTMENT_OTHER): Payer: Self-pay | Admitting: Family Medicine

## 2023-09-01 ENCOUNTER — Ambulatory Visit (INDEPENDENT_AMBULATORY_CARE_PROVIDER_SITE_OTHER): Payer: BC Managed Care – PPO | Admitting: Family Medicine

## 2023-09-01 ENCOUNTER — Other Ambulatory Visit (HOSPITAL_BASED_OUTPATIENT_CLINIC_OR_DEPARTMENT_OTHER): Payer: Self-pay

## 2023-09-01 VITALS — BP 151/86 | HR 61 | Temp 98.6°F | Ht 64.0 in | Wt 255.0 lb

## 2023-09-01 DIAGNOSIS — E559 Vitamin D deficiency, unspecified: Secondary | ICD-10-CM | POA: Diagnosis not present

## 2023-09-01 DIAGNOSIS — R7303 Prediabetes: Secondary | ICD-10-CM | POA: Insufficient documentation

## 2023-09-01 DIAGNOSIS — I10 Essential (primary) hypertension: Secondary | ICD-10-CM | POA: Insufficient documentation

## 2023-09-01 DIAGNOSIS — Z0000 Encounter for general adult medical examination without abnormal findings: Secondary | ICD-10-CM | POA: Diagnosis not present

## 2023-09-01 DIAGNOSIS — Z13220 Encounter for screening for lipoid disorders: Secondary | ICD-10-CM

## 2023-09-01 DIAGNOSIS — L84 Corns and callosities: Secondary | ICD-10-CM

## 2023-09-01 DIAGNOSIS — B351 Tinea unguium: Secondary | ICD-10-CM

## 2023-09-01 MED ORDER — CHOLECALCIFEROL 1.25 MG (50000 UT) PO CAPS
50000.0000 [IU] | ORAL_CAPSULE | ORAL | 1 refills | Status: AC
  Filled 2023-09-01: qty 12, 84d supply, fill #0
  Filled 2024-02-26 – 2024-02-27 (×2): qty 12, 84d supply, fill #1
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
## 2023-09-01 NOTE — Patient Instructions (Signed)

## 2023-09-02 ENCOUNTER — Encounter (HOSPITAL_BASED_OUTPATIENT_CLINIC_OR_DEPARTMENT_OTHER): Payer: Self-pay | Admitting: Family Medicine

## 2023-09-02 LAB — BASIC METABOLIC PANEL
BUN/Creatinine Ratio: 13 (ref 9–23)
BUN: 11 mg/dL (ref 6–20)
CO2: 20 mmol/L (ref 20–29)
Calcium: 9.6 mg/dL (ref 8.7–10.2)
Chloride: 106 mmol/L (ref 96–106)
Creatinine, Ser: 0.86 mg/dL (ref 0.57–1.00)
Glucose: 95 mg/dL (ref 70–99)
Potassium: 4.4 mmol/L (ref 3.5–5.2)
Sodium: 140 mmol/L (ref 134–144)
eGFR: 91 mL/min/{1.73_m2} (ref >59)

## 2023-09-02 LAB — LIPID PANEL
Chol/HDL Ratio: 4 ratio (ref 0.0–4.4)
Cholesterol, Total: 160 mg/dL (ref 100–199)
HDL: 40 mg/dL (ref >39)
LDL Chol Calc (NIH): 99 mg/dL (ref 0–99)
Triglycerides: 116 mg/dL (ref 0–149)
VLDL Cholesterol Cal: 21 mg/dL (ref 5–40)

## 2023-09-02 LAB — HEMOGLOBIN A1C
Est. average glucose Bld gHb Est-mCnc: 105 mg/dL
Hgb A1c MFr Bld: 5.3 % (ref 4.8–5.6)

## 2023-09-02 NOTE — Progress Notes (Signed)
 Hi Michele Pennington, Your cholesterol is excellent.  Your A1c is back in a normal range and is not in the prediabetic range which is great!  Electrolytes and kidney function were normal.  I am concerned about your blood pressure and would recommend getting you started on blood pressure medication as this has been consistently high for previous visits.  If you are agreeable to this please let me know and we can get you started on some medication.  The best way to lower blood pressure is diet and exercise changes which based on your lab work looks like you have been implementing.  Sometimes there is a genetic component to hypertension as well and cannot be improved with diet and exercise.

## 2023-09-09 ENCOUNTER — Encounter (HOSPITAL_BASED_OUTPATIENT_CLINIC_OR_DEPARTMENT_OTHER): Payer: Self-pay | Admitting: Obstetrics & Gynecology

## 2023-09-09 ENCOUNTER — Ambulatory Visit (HOSPITAL_BASED_OUTPATIENT_CLINIC_OR_DEPARTMENT_OTHER): Payer: 59 | Admitting: Obstetrics & Gynecology

## 2023-09-09 ENCOUNTER — Other Ambulatory Visit (HOSPITAL_COMMUNITY)
Admission: RE | Admit: 2023-09-09 | Discharge: 2023-09-09 | Disposition: A | Discharge status: Home / Self Care | Source: Ambulatory Visit | Attending: Obstetrics & Gynecology | Admitting: Obstetrics & Gynecology

## 2023-09-09 VITALS — BP 128/78 | HR 71 | Ht 63.0 in | Wt 257.0 lb

## 2023-09-09 DIAGNOSIS — N921 Excessive and frequent menstruation with irregular cycle: Secondary | ICD-10-CM | POA: Diagnosis not present

## 2023-09-09 DIAGNOSIS — D500 Iron deficiency anemia secondary to blood loss (chronic): Secondary | ICD-10-CM | POA: Diagnosis not present

## 2023-09-09 DIAGNOSIS — D251 Intramural leiomyoma of uterus: Secondary | ICD-10-CM

## 2023-09-09 DIAGNOSIS — Z124 Encounter for screening for malignant neoplasm of cervix: Secondary | ICD-10-CM
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
## 2023-09-10 ENCOUNTER — Other Ambulatory Visit (HOSPITAL_BASED_OUTPATIENT_CLINIC_OR_DEPARTMENT_OTHER): Payer: Self-pay

## 2023-09-10 ENCOUNTER — Encounter: Payer: Self-pay | Admitting: Podiatry

## 2023-09-10 ENCOUNTER — Ambulatory Visit (INDEPENDENT_AMBULATORY_CARE_PROVIDER_SITE_OTHER)

## 2023-09-10 ENCOUNTER — Ambulatory Visit: Admitting: Podiatry

## 2023-09-10 DIAGNOSIS — M2011 Hallux valgus (acquired), right foot: Secondary | ICD-10-CM

## 2023-09-10 DIAGNOSIS — B351 Tinea unguium: Secondary | ICD-10-CM | POA: Diagnosis not present

## 2023-09-10 DIAGNOSIS — M21611 Bunion of right foot: Secondary | ICD-10-CM | POA: Diagnosis not present

## 2023-09-10 DIAGNOSIS — B353 Tinea pedis: Secondary | ICD-10-CM

## 2023-09-10 DIAGNOSIS — M722 Plantar fascial fibromatosis: Secondary | ICD-10-CM | POA: Diagnosis not present

## 2023-09-10 MED ORDER — TERBINAFINE HCL 250 MG PO TABS
250.0000 mg | ORAL_TABLET | Freq: Every day | ORAL | 0 refills | Status: AC
Start: 2023-09-10 — End: 2023-12-09
  Filled 2023-09-10: qty 30, 30d supply, fill #0

## 2023-09-10 MED ORDER — KETOCONAZOLE 2 % EX CREA
1.0000 | TOPICAL_CREAM | Freq: Every day | CUTANEOUS | 2 refills | Status: DC
Start: 2023-09-10 — End: 2023-09-23
  Filled 2023-09-10: qty 30, 30d supply, fill #0

## 2023-09-10 MED ORDER — PREDNISONE 5 MG PO TABS
ORAL_TABLET | ORAL | 0 refills | Status: AC
  Filled 2023-09-10: qty 31, 6d supply, fill #0

## 2023-09-10 NOTE — Patient Instructions (Addendum)
 VISIT SUMMARY:  During today's visit, we addressed your toenail issues, foot pain, and other related concerns. We discussed the causes and treatments for your nail fungus, athlete's foot, plantar fasciitis, and bunion. We also reviewed your current medications and lifestyle factors that may be contributing to your symptoms.  YOUR PLAN:  -ONYCHOMYCOSIS: Onychomycosis is a fungal infection of the toenails, causing discoloration and nail changes. We will treat this with oral terbinafine once daily for three months and ketoconazole cream applied twice daily to the bottom of your foot and between your toes. Continue using Tenactin spray in your shoes to prevent recurrence.  -TINEA PEDIS: Tinea Pedis, commonly known as athlete's foot, is a fungal infection that causes dry, scaling skin on the bottom of the foot. We will treat this with ketoconazole cream applied twice daily to the bottom of your foot and between your toes. Continue using Tenactin spray in your shoes to prevent recurrence.  -PLANTAR FASCIITIS: Plantar fasciitis is inflammation of the tissue along the bottom of your foot, causing pain in the arch. We will treat this with a prednisone taper to reduce inflammation and pain, physical therapy exercises to stretch and strengthen your foot and calf muscles, and recommend orthotic inserts for better arch support.  -BUNION: A bunion is a bony bump that forms on the joint at the base of your big toe. Currently, it is not painful enough for surgery. We recommend wearing wide shoes with stretchy material, using a bunion shield with a spacer, and taking Motrin or Aleve for pain management if needed.  INSTRUCTIONS:  Please schedule a follow-up appointment in three months to assess your progress. Additionally, ensure you schedule an appointment for orthotic fitting before the follow-up visit. Contact our office if your pain worsens or if you have any concerns with the treatment.  Plantar Fasciitis (Heel  Spur Syndrome) with Rehab The plantar fascia is a fibrous, ligament-like, soft-tissue structure that spans the bottom of the foot. Plantar fasciitis is a condition that causes pain in the foot due to inflammation of the tissue. SYMPTOMS  Pain and tenderness on the underneath side of the foot. Pain that worsens with standing or walking. CAUSES  Plantar fasciitis is caused by irritation and injury to the plantar fascia on the underneath side of the foot. Common mechanisms of injury include: Direct trauma to bottom of the foot. Damage to a small nerve that runs under the foot where the main fascia attaches to the heel bone. Stress placed on the plantar fascia due to bone spurs. RISK INCREASES WITH:  Activities that place stress on the plantar fascia (running, jumping, pivoting, or cutting). Poor strength and flexibility. Improperly fitted shoes. Tight calf muscles. Flat feet. Failure to warm-up properly before activity. Obesity. PREVENTION Warm up and stretch properly before activity. Allow for adequate recovery between workouts. Maintain physical fitness: Strength, flexibility, and endurance. Cardiovascular fitness. Maintain a health body weight. Avoid stress on the plantar fascia. Wear properly fitted shoes, including arch supports for individuals who have flat feet.  PROGNOSIS  If treated properly, then the symptoms of plantar fasciitis usually resolve without surgery. However, occasionally surgery is necessary.  RELATED COMPLICATIONS  Recurrent symptoms that may result in a chronic condition. Problems of the lower back that are caused by compensating for the injury, such as limping. Pain or weakness of the foot during push-off following surgery. Chronic inflammation, scarring, and partial or complete fascia tear, occurring more often from repeated injections.  TREATMENT  Treatment initially involves the use  of ice and medication to help reduce pain and inflammation. The use of  strengthening and stretching exercises may help reduce pain with activity, especially stretches of the Achilles tendon. These exercises may be performed at home or with a therapist. Your caregiver may recommend that you use heel cups of arch supports to help reduce stress on the plantar fascia. Occasionally, corticosteroid injections are given to reduce inflammation. If symptoms persist for greater than 6 months despite non-surgical (conservative), then surgery may be recommended.   MEDICATION  If pain medication is necessary, then nonsteroidal anti-inflammatory medications, such as aspirin and ibuprofen, or other minor pain relievers, such as acetaminophen, are often recommended. Do not take pain medication within 7 days before surgery. Prescription pain relievers may be given if deemed necessary by your caregiver. Use only as directed and only as much as you need. Corticosteroid injections may be given by your caregiver. These injections should be reserved for the most serious cases, because they may only be given a certain number of times.  HEAT AND COLD Cold treatment (icing) relieves pain and reduces inflammation. Cold treatment should be applied for 10 to 15 minutes every 2 to 3 hours for inflammation and pain and immediately after any activity that aggravates your symptoms. Use ice packs or massage the area with a piece of ice (ice massage). Heat treatment may be used prior to performing the stretching and strengthening activities prescribed by your caregiver, physical therapist, or athletic trainer. Use a heat pack or soak the injury in warm water.  SEEK IMMEDIATE MEDICAL CARE IF: Treatment seems to offer no benefit, or the condition worsens. Any medications produce adverse side effects.  EXERCISES- RANGE OF MOTION (ROM) AND STRETCHING EXERCISES - Plantar Fasciitis (Heel Spur Syndrome) These exercises may help you when beginning to rehabilitate your injury. Your symptoms may resolve with or  without further involvement from your physician, physical therapist or athletic trainer. While completing these exercises, remember:  Restoring tissue flexibility helps normal motion to return to the joints. This allows healthier, less painful movement and activity. An effective stretch should be held for at least 30 seconds. A stretch should never be painful. You should only feel a gentle lengthening or release in the stretched tissue.  RANGE OF MOTION - Toe Extension, Flexion Sit with your right / left leg crossed over your opposite knee. Grasp your toes and gently pull them back toward the top of your foot. You should feel a stretch on the bottom of your toes and/or foot. Hold this stretch for 10 seconds. Now, gently pull your toes toward the bottom of your foot. You should feel a stretch on the top of your toes and or foot. Hold this stretch for 10 seconds. Repeat  times. Complete this stretch 3 times per day.   RANGE OF MOTION - Ankle Dorsiflexion, Active Assisted Remove shoes and sit on a chair that is preferably not on a carpeted surface. Place right / left foot under knee. Extend your opposite leg for support. Keeping your heel down, slide your right / left foot back toward the chair until you feel a stretch at your ankle or calf. If you do not feel a stretch, slide your bottom forward to the edge of the chair, while still keeping your heel down. Hold this stretch for 10 seconds. Repeat 3 times. Complete this stretch 2 times per day.   STRETCH  Gastroc, Standing Place hands on wall. Extend right / left leg, keeping the front knee somewhat bent.  Slightly point your toes inward on your back foot. Keeping your right / left heel on the floor and your knee straight, shift your weight toward the wall, not allowing your back to arch. You should feel a gentle stretch in the right / left calf. Hold this position for 10 seconds. Repeat 3 times. Complete this stretch 2 times per  day.  STRETCH  Soleus, Standing Place hands on wall. Extend right / left leg, keeping the other knee somewhat bent. Slightly point your toes inward on your back foot. Keep your right / left heel on the floor, bend your back knee, and slightly shift your weight over the back leg so that you feel a gentle stretch deep in your back calf. Hold this position for 10 seconds. Repeat 3 times. Complete this stretch 2 times per day.  STRETCH  Gastrocsoleus, Standing  Note: This exercise can place a lot of stress on your foot and ankle. Please complete this exercise only if specifically instructed by your caregiver.  Place the ball of your right / left foot on a step, keeping your other foot firmly on the same step. Hold on to the wall or a rail for balance. Slowly lift your other foot, allowing your body weight to press your heel down over the edge of the step. You should feel a stretch in your right / left calf. Hold this position for 10 seconds. Repeat this exercise with a slight bend in your right / left knee. Repeat 3 times. Complete this stretch 2 times per day.   STRENGTHENING EXERCISES - Plantar Fasciitis (Heel Spur Syndrome)  These exercises may help you when beginning to rehabilitate your injury. They may resolve your symptoms with or without further involvement from your physician, physical therapist or athletic trainer. While completing these exercises, remember:  Muscles can gain both the endurance and the strength needed for everyday activities through controlled exercises. Complete these exercises as instructed by your physician, physical therapist or athletic trainer. Progress the resistance and repetitions only as guided.  STRENGTH - Towel Curls Sit in a chair positioned on a non-carpeted surface. Place your foot on a towel, keeping your heel on the floor. Pull the towel toward your heel by only curling your toes. Keep your heel on the floor. Repeat 3 times. Complete this exercise  2 times per day.  STRENGTH - Ankle Inversion Secure one end of a rubber exercise band/tubing to a fixed object (table, pole). Loop the other end around your foot just before your toes. Place your fists between your knees. This will focus your strengthening at your ankle. Slowly, pull your big toe up and in, making sure the band/tubing is positioned to resist the entire motion. Hold this position for 10 seconds. Have your muscles resist the band/tubing as it slowly pulls your foot back to the starting position. Repeat 3 times. Complete this exercises 2 times per day.  Document Released: 06/16/2005 Document Revised: 09/08/2011 Document Reviewed: 09/28/2008 [facility] Patient Information 2014 [place], [place].

## 2023-09-10 NOTE — Progress Notes (Signed)
 Subjective:  Patient ID: Michele Pennington, female    DOB: 01/28/1991,  MRN: [id number]  Chief Complaint  Patient presents with   Nail Problem    Patient is concern about discoloration in bilateral toe nails and also she has  bump on the side of her right foot but does not know if it is a bump or bunion. Patient states she has pain at the bottom in the arch area of her right foot    Discussed the use of AI scribe software for clinical note transcription with the patient, who gave verbal consent to proceed.  History of Present Illness   The patient presents with toenail issues and foot pain following a previous foot fracture.  She has toenail issues, including a toenail that came off, discoloration, and dry skin on the bottom of her foot. She has been applying lotion but is unsure of the cause. She suspects athlete's foot due to previous symptoms and has used a spray, possibly Tinactin, to manage it. No other skin issues such as eczema or psoriasis are reported.  She has a history of a foot fracture in the summer, involving the pectoral ligament in her ankle. Despite completing physical therapy in December, she still experiences pain on the inside of her foot. She was unable to get pedicures during her recovery, which she feels contributed to the current state of her foot.  She is currently taking iron supplements for anemia, Prozac for mental health, and a medication to reduce bleeding, which she takes three times a day. She also takes vitamin C. Her anemia was discovered after she fainted, which led to the foot fracture. No history of liver or kidney problems is reported.  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 She wears supportive shoes to manage her balance and support her foot.          Objective:    Physical Exam   VASCULAR: DP and PT pulse palpable. Foot is warm and well-perfused. Capillary  fill time is brisk. DERMATOLOGIC: Tinea pedis with moccasin distribution of dry scaling skin. Normal skin turgor, texture, and temperature. No open lesions, rashes, or ulcerations. NEUROLOGIC: Normal sensation to light touch and pressure. No paresthesias on examination. ORTHOPEDIC: Pes planus deformity, hallux valgus with slight metatarsal adductus. Bunion on the right foot medially. Smooth pain-free range of motion of all examined joints. No ecchymosis or bruising.            Results   RADIOLOGY: Radiographs of the right foot Description: Radiographs of the right foot taken today show pes planus deformity, hallux valgus with slight metatarsus adductus. No heel spur or other abnormalities. Previous fracture at the dorsal navicular is well-healed.      Assessment:   1. Hallux valgus with bunions, right   2. Plantar fasciitis of right foot   3. Onychomycosis   4. Tinea pedis of both feet      Plan:  Patient was evaluated and treated and all questions answered.  Assessment and Plan    Onychomycosis Early-stage nail fungus with discoloration of multiple toenails and regrowing right hallux nail with dystrophy, likely secondary to tinea pedis and exacerbated by previous immobilization and lack of pedicures. Early intervention is crucial for effective treatment. Oral terbinafine is the best treatment for nail fungus, especially when caught early.   - Prescribe oral terbinafine (Lamisil) once daily for three months. - Discuss potential side effects of terbinafine,  including nausea, cramps, and diarrhea. - Prescribe ketoconazole cream to be applied twice daily to the bottom of the foot and between the toes. - Advise continuation of Tinactin spray in shoes to prevent recurrence.  Tinea Pedis Moccasin distribution of dry scaling skin on the bottom of the foot, likely due to athlete's foot exacerbated by wearing a boot and closed shoes post-injury. - Prescribe ketoconazole cream to be  applied twice daily to the bottom of the foot and between the toes. - Advise continuation of Tinactin spray in shoes to prevent recurrence.  Plantar Fasciitis Pain in the arch of the foot, likely due to plantar fasciitis associated with pes planus deformity, exacerbated by prolonged standing on hard surfaces and lack of supportive footwear. A week of prednisone is recommended to reduce inflammation and pain. Long-term use of prednisone can cause weight gain and immune suppression, but a short course is expected to alleviate symptoms without significant side effects. - Prescribe a prednisone taper to reduce inflammation and pain. - Provide physical therapy exercises to stretch and strengthen the foot and calf muscles. - Recommend custom orthotic inserts for shoes to provide arch support. - Schedule follow-up visit with Nicki Guadalajara for orthotic fitting.  Bunion Bunion on the right foot medially, likely hereditary. Currently not painful enough to warrant surgical intervention. Surgery may be considered in the future if pain increases or arthritis develops. Surgical correction would need midfoot fusions with Lapidus and metatarsus adductus corrections - Recommend wearing wide shoes with stretchy material to reduce pressure on the bunion. - Suggest using a bunion shield with a spacer for additional comfort and to prevent toe drifting. - Advise taking Motrin or Aleve for pain management if needed.  Follow-up Follow-up is necessary to monitor the progress of treatments and adjust plans as needed.  - Schedule follow-up appointment in three months to assess progress. - Ensure Tricia schedules an appointment for orthotic fitting before the follow-up visit. - Advise contacting the office if pain worsens or if there are any concerns with the treatment.          Return in about 3 months (around 12/11/2023) for follow up after nail fungus treatment, recheck plantar fasciitis.

## 2023-09-11 ENCOUNTER — Encounter (HOSPITAL_BASED_OUTPATIENT_CLINIC_OR_DEPARTMENT_OTHER): Payer: Self-pay | Admitting: Obstetrics & Gynecology

## 2023-09-11 ENCOUNTER — Other Ambulatory Visit (HOSPITAL_BASED_OUTPATIENT_CLINIC_OR_DEPARTMENT_OTHER): Payer: Self-pay

## 2023-09-11 LAB — CYTOLOGY - PAP
Adequacy: ABSENT
Comment: NEGATIVE
Diagnosis: NEGATIVE
High risk HPV: NEGATIVE

## 2023-09-11 MED ORDER — METRONIDAZOLE 500 MG PO TABS
500.0000 mg | ORAL_TABLET | Freq: Two times a day (BID) | ORAL | 0 refills | Status: DC
  Filled 2023-09-11 – 2023-09-21 (×2): qty 14, 7d supply, fill #0

## 2023-09-21 ENCOUNTER — Other Ambulatory Visit (HOSPITAL_BASED_OUTPATIENT_CLINIC_OR_DEPARTMENT_OTHER): Payer: Self-pay

## 2023-09-23 ENCOUNTER — Other Ambulatory Visit (HOSPITAL_BASED_OUTPATIENT_CLINIC_OR_DEPARTMENT_OTHER): Payer: Self-pay

## 2023-09-23 ENCOUNTER — Encounter: Payer: Self-pay | Admitting: Family

## 2023-09-23 ENCOUNTER — Ambulatory Visit (INDEPENDENT_AMBULATORY_CARE_PROVIDER_SITE_OTHER): Admitting: Family

## 2023-09-23 ENCOUNTER — Ambulatory Visit (HOSPITAL_BASED_OUTPATIENT_CLINIC_OR_DEPARTMENT_OTHER): Payer: Self-pay | Admitting: Family Medicine

## 2023-09-23 VITALS — BP 136/89 | HR 68 | Temp 97.2°F | Ht 63.0 in | Wt 249.8 lb

## 2023-09-23 DIAGNOSIS — K529 Noninfective gastroenteritis and colitis, unspecified: Secondary | ICD-10-CM

## 2023-09-23 MED ORDER — LOPERAMIDE HCL 2 MG PO TABS
2.0000 mg | ORAL_TABLET | Freq: Four times a day (QID) | ORAL | 0 refills | Status: DC | PRN
  Filled 2023-09-23 (×2): qty 24, 6d supply, fill #0

## 2023-09-23 MED ORDER — ONDANSETRON 4 MG PO TBDP
4.0000 mg | ORAL_TABLET | Freq: Three times a day (TID) | ORAL | 0 refills | Status: DC | PRN
  Filled 2023-09-23: qty 18, 6d supply, fill #0
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
## 2023-09-23 NOTE — Telephone Encounter (Signed)
 Copied from CRM [id number]. Topic: Clinical - Red Word Triage >> Sep 23, 2023  9:14 AM Izetta Dakin wrote: [facility] that prompted transfer to Nurse Triage: Vomitting, diarrhea, has not been able to eat since noon yesterday.  Chief Complaint: Vomiting and diarrhea Symptoms: Chills Frequency: Since yesterday afternoon Pertinent Negatives: Patient denies dizziness/weakness Disposition: [] ED /[] Urgent Care (no appt availability in office) / [x] Appointment(In office/virtual)/ []  [place] [facility]/ [] Home Care/ [] Refused Recommended Disposition /[] [place] [facility]/ []  Follow-up with PCP Additional Notes: Patient called in to report vomiting and diarrhea that started yesterday afternoon. Patient stated she has had diarrhea 5 times today. Patient her mom is also at home with the same symptoms. Patient reported mild abdominal pain/cramps. Patient denied dizziness/weakness at this time. Patient report chills last night and stated her mom is running a fever. Patient stated they had [employer] for dinner 2 nights ago, but her diet has been otherwise normal. Patient has been able to keep Gatorade down. This RN advised patient to see a provider within 24 hours, per protocol. No availability with PCP/PCP office. This RN scheduled patient with provider at alternate office. This RN advised patient to call back if symptoms worsen. Patient complied.   Reason for Disposition  Abdominal pain  (Exception: Pain clears with each passage of diarrhea stool.)  Answer Assessment - Initial Assessment Questions 1. DIARRHEA SEVERITY: "How bad is the diarrhea?" "How many more stools have you had in the past 24 hours than normal?"    - NO DIARRHEA (SCALE 0)   - MILD (SCALE 1-3): Few loose or mushy BMs; increase of 1-3 stools over normal daily number of stools; mild increase in ostomy output.   -  MODERATE (SCALE 4-7): Increase of 4-6 stools daily over normal; moderate increase in ostomy output.   -  SEVERE (SCALE 8-10; OR "WORST  POSSIBLE"): Increase of 7 or more stools daily over normal; moderate increase in ostomy output; incontinence.     Moderate- states she has gone 5 times already today 2. ONSET: "When did the diarrhea begin?"      3 pm yesterday 3. BM CONSISTENCY: "How loose or watery is the diarrhea?"      States stools were watery yesterday, but are slightly more formed today 4. VOMITING: "Are you also vomiting?" If Yes, ask: "How many times in the past 24 hours?"      Yes about 4 times 5. ABDOMEN PAIN: "Are you having any abdomen pain?" If Yes, ask: "What does it feel like?" (e.g., crampy, dull, intermittent, constant)      Slight abdominal pain 6. ABDOMEN PAIN SEVERITY: If present, ask: "How bad is the pain?"  (e.g., Scale 1-10; mild, moderate, or severe)   - MILD (1-3): doesn't interfere with normal activities, abdomen soft and not tender to touch    - MODERATE (4-7): interferes with normal activities or awakens from sleep, abdomen tender to touch    - SEVERE (8-10): excruciating pain, doubled over, unable to do any normal activities       States abdominal pain is crampy 7. ORAL INTAKE: If vomiting, "Have you been able to drink liquids?" "How much liquids have you had in the past 24 hours?"     States she has been able to keep Gatorade down 8. HYDRATION: "Any signs of dehydration?" (e.g., dry mouth [not just dry lips], too weak to stand, dizziness, new weight loss) "When did you last urinate?"     Denies dizziness and weakness at this time 9.  EXPOSURE: "Have you traveled to a foreign country recently?" "Have you been exposed to anyone with diarrhea?" "Could you have eaten any food that was spoiled?"     States mom is also having vomiting and diarrhea, states they ate [employer] for dinner 2 days ago 10. ANTIBIOTIC USE: "Are you taking antibiotics now or have you taken antibiotics in the past 2 months?"       N/A 11. OTHER SYMPTOMS: "Do you have any other symptoms?" (e.g., fever, blood in stool)       States she  has not been able to eat since lunch yesterday, chills last night  Protocols used: Diarrhea-A-AH

## 2023-09-24 ENCOUNTER — Other Ambulatory Visit (HOSPITAL_BASED_OUTPATIENT_CLINIC_OR_DEPARTMENT_OTHER): Payer: Self-pay

## 2023-09-28 ENCOUNTER — Other Ambulatory Visit (HOSPITAL_BASED_OUTPATIENT_CLINIC_OR_DEPARTMENT_OTHER): Payer: Self-pay

## 2023-11-04 ENCOUNTER — Other Ambulatory Visit (HOSPITAL_BASED_OUTPATIENT_CLINIC_OR_DEPARTMENT_OTHER): Payer: Self-pay

## 2023-11-12 ENCOUNTER — Encounter (HOSPITAL_BASED_OUTPATIENT_CLINIC_OR_DEPARTMENT_OTHER): Payer: Self-pay

## 2023-11-12 ENCOUNTER — Emergency Department (HOSPITAL_BASED_OUTPATIENT_CLINIC_OR_DEPARTMENT_OTHER)
Admission: EM | Admit: 2023-11-12 | Discharge: 2023-11-12 | Disposition: A | Discharge status: Home / Self Care | Attending: Emergency Medicine | Admitting: Emergency Medicine

## 2023-11-12 ENCOUNTER — Other Ambulatory Visit (HOSPITAL_BASED_OUTPATIENT_CLINIC_OR_DEPARTMENT_OTHER): Payer: Self-pay

## 2023-11-12 ENCOUNTER — Other Ambulatory Visit: Payer: Self-pay

## 2023-11-12 ENCOUNTER — Emergency Department (HOSPITAL_BASED_OUTPATIENT_CLINIC_OR_DEPARTMENT_OTHER): Admitting: Radiology

## 2023-11-12 DIAGNOSIS — R072 Precordial pain: Secondary | ICD-10-CM | POA: Diagnosis not present

## 2023-11-12 DIAGNOSIS — R112 Nausea with vomiting, unspecified: Secondary | ICD-10-CM | POA: Insufficient documentation

## 2023-11-12 DIAGNOSIS — I10 Essential (primary) hypertension: Secondary | ICD-10-CM | POA: Diagnosis not present

## 2023-11-12 DIAGNOSIS — Z79899 Other long term (current) drug therapy: Secondary | ICD-10-CM | POA: Insufficient documentation

## 2023-11-12 DIAGNOSIS — R42 Dizziness and giddiness: Secondary | ICD-10-CM | POA: Insufficient documentation

## 2023-11-12 DIAGNOSIS — R0789 Other chest pain: Secondary | ICD-10-CM

## 2023-11-12 LAB — CBC
HCT: 32.7 % — ABNORMAL LOW (ref 36.0–46.0)
Hemoglobin: 10.4 g/dL — ABNORMAL LOW (ref 12.0–15.0)
MCH: 26 pg (ref 26.0–34.0)
MCHC: 31.8 g/dL (ref 30.0–36.0)
MCV: 81.8 fL (ref 80.0–100.0)
Platelets: 302 10*3/uL (ref 150–400)
RBC: 4 MIL/uL (ref 3.87–5.11)
RDW: 15.6 % — ABNORMAL HIGH (ref 11.5–15.5)
WBC: 6.2 10*3/uL (ref 4.0–10.5)
nRBC: 0 % (ref 0.0–0.2)

## 2023-11-12 LAB — URINALYSIS, ROUTINE W REFLEX MICROSCOPIC
Bacteria, UA: NONE SEEN
Bilirubin Urine: NEGATIVE
Glucose, UA: NEGATIVE mg/dL
Ketones, ur: NEGATIVE mg/dL
Leukocytes,Ua: NEGATIVE
Nitrite: NEGATIVE
Specific Gravity, Urine: 1.03 (ref 1.005–1.030)
pH: 5.5 (ref 5.0–8.0)

## 2023-11-12 LAB — COMPREHENSIVE METABOLIC PANEL WITH GFR
ALT: 9 U/L (ref 0–44)
AST: 34 U/L (ref 15–41)
Albumin: 3.9 g/dL (ref 3.5–5.0)
Alkaline Phosphatase: 33 U/L — ABNORMAL LOW (ref 38–126)
Anion gap: 13 (ref 5–15)
BUN: 10 mg/dL (ref 6–20)
CO2: 20 mmol/L — ABNORMAL LOW (ref 22–32)
Calcium: 9.5 mg/dL (ref 8.9–10.3)
Chloride: 105 mmol/L (ref 98–111)
Creatinine, Ser: 0.97 mg/dL (ref 0.44–1.00)
GFR, Estimated: >60 mL/min (ref >60)
Glucose, Bld: 122 mg/dL — ABNORMAL HIGH (ref 70–99)
Potassium: 4.5 mmol/L (ref 3.5–5.1)
Sodium: 138 mmol/L (ref 135–145)
Total Bilirubin: 0.3 mg/dL (ref 0.0–1.2)
Total Protein: 6.8 g/dL (ref 6.5–8.1)

## 2023-11-12 LAB — LIPASE, BLOOD: Lipase: 30 U/L (ref 11–51)

## 2023-11-12 LAB — PREGNANCY, URINE: Preg Test, Ur: NEGATIVE

## 2023-11-12 LAB — TROPONIN T, HIGH SENSITIVITY: Troponin T High Sensitivity: <15 ng/L (ref <19)

## 2023-11-12 MED ORDER — KETOROLAC TROMETHAMINE 15 MG/ML IJ SOLN
15.0000 mg | Freq: Once | INTRAMUSCULAR | Status: AC
  Administered 2023-11-12: 15 mg via INTRAVENOUS
  Filled 2023-11-12: qty 1

## 2023-11-12 MED ORDER — ONDANSETRON 4 MG PO TBDP
4.0000 mg | ORAL_TABLET | Freq: Three times a day (TID) | ORAL | 0 refills | Status: DC | PRN
  Filled 2023-11-12: qty 18, 21d supply, fill #0

## 2023-11-12 MED ORDER — PREDNISONE 20 MG PO TABS
40.0000 mg | ORAL_TABLET | Freq: Every day | ORAL | 0 refills | Status: AC
  Filled 2023-11-12: qty 10, 5d supply, fill #0

## 2023-11-12 MED ORDER — EPINEPHRINE 0.3 MG/0.3ML IJ SOAJ
0.3000 mg | INTRAMUSCULAR | 0 refills | Status: DC | PRN
  Filled 2023-11-12: qty 2, 30d supply, fill #0

## 2023-11-12 MED ORDER — SODIUM CHLORIDE 0.9 % IV BOLUS
1000.0000 mL | Freq: Once | INTRAVENOUS | Status: AC
  Administered 2023-11-12: 1000 mL via INTRAVENOUS

## 2023-11-12 NOTE — ED Triage Notes (Signed)
 Pt to ED c/o she thinks she had an allergic reaction to imaging with contrast yesterday . Reports had imaging for suspected fibroids, after imaging and into this morning pt reports feeling dizzy, n/v, constant chest pain. No s/s of acute distress noted in triage.

## 2023-11-12 NOTE — ED Provider Notes (Signed)
 [place] EMERGENCY DEPARTMENT AT [facility] Provider Note   CSN: [id number] Arrival date & time: 11/12/23  0900     History  Chief Complaint  Patient presents with   Allergic Reaction    Michele Pennington is a 33 y.o. female.   Allergic Reaction   33 year old female presents emergency department with complaints of nausea, vomiting, chest pain, dizziness.  States that she had an MRI of her pelvis with contrast yesterday for further assessment regarding a fibroid.  MRI was performed around 445 when she received contrast.  States that she felt fine until around 720 yesterday evening when she got home.  Reports development of central chest pain, feelings of nausea and then had 4-5 episodes of nonbloody emesis.  Denies any shortness of breath, rash, feelings of throat closing on her, difficulty breathing.  States she did not take anything for her symptoms.  States that the nausea as well as vomiting resolved today.  Has been tolerating p.o.  Does still report some chest pain centrally.  Denies any history of allergic reaction contrast before in the past.  Past medical history significant for GAD, chronic fatigue, hypertension, menorrhagia, prediabetes  Home Medications Prior to Admission medications   Medication Sig Start Date End Date Taking? Authorizing Provider  Cholecalciferol  1.25 MG (50000 UT) capsule Take 1 capsule (50,000 Units total) by mouth once a week. 09/01/23   Caudle, Arcola Kocher, FNP  ferrous sulfate  325 (65 FE) MG tablet Take 1 tablet (325 mg total) by mouth daily. 07/02/23   Caudle, Arcola Kocher, FNP  FLUoxetine  (PROZAC ) 10 MG capsule Take 1 capsule (10 mg total) by mouth daily. 06/03/23   Caudle, Arcola Kocher, FNP  ibuprofen  (ADVIL ) 200 MG tablet Take 400 mg by mouth every 6 (six) hours as needed.    [provider]  loperamide  (IMODIUM  A-D) 2 MG tablet Take 1 tablet (2 mg total) by mouth 4 (four) times daily as needed for diarrhea or loose  stools. Start Thursday if able. 09/23/23   Versa Gore, NP  metroNIDAZOLE  (FLAGYL ) 500 MG tablet Take 1 tablet (500 mg total) by mouth 2 (two) times daily. 09/11/23   Lillian Rein, MD  norethindrone  (AYGESTIN ) 5 MG tablet Take 1 tablet by mouth three times daily until bleeding stops then decrease to once daily 07/29/23   Lo, Donna K, CNM  ondansetron  (ZOFRAN -ODT) 4 MG disintegrating tablet Take 1 tablet (4 mg total) by mouth every 8 (eight) hours as needed for nausea or vomiting. 09/23/23   Versa Gore, NP  propranolol  ER (INDERAL  LA) 60 MG 24 hr capsule Take 1 capsule (60 mg total) by mouth daily. 05/01/23 11/27/23  Camara, Amadou, MD  SUMAtriptan  (IMITREX ) 50 MG tablet Take 1 tablet (50 mg total) by mouth every 2 (two) hours as needed. May repeat in 2 hours if headache persists or recurs. 05/01/23   Camara, Amadou, MD  terbinafine  (LAMISIL ) 250 MG tablet Take 1 tablet (250 mg total) by mouth daily. 09/10/23 12/09/23  Floyce Hutching, DPM      Allergies    Patient has no known allergies.    Review of Systems   Review of Systems  All other systems reviewed and are negative.   Physical Exam Updated Vital Signs BP (!) 139/90 (BP Location: Right Arm)   Pulse 87   Temp 98.2 F (36.8 C) (Oral)   Resp 17   Ht 5\' 4"  (1.626 m)   Wt 108.9 kg   LMP 11/12/2023   SpO2  99%   BMI 41.20 kg/m  Physical Exam Vitals and nursing note reviewed.  Constitutional:      General: She is not in acute distress.    Appearance: She is well-developed.  HENT:     Head: Normocephalic and atraumatic.  Eyes:     Conjunctiva/sclera: Conjunctivae normal.  Cardiovascular:     Rate and Rhythm: Normal rate and regular rhythm.     Pulses: Normal pulses.     Heart sounds: No murmur heard. Pulmonary:     Effort: Pulmonary effort is normal. No respiratory distress.     Breath sounds: Normal breath sounds. No wheezing, rhonchi or rales.     Comments: Midsternal chest tenderness. Abdominal:      Palpations: Abdomen is soft.     Tenderness: There is no abdominal tenderness. There is no guarding.  Musculoskeletal:        General: No swelling.     Cervical back: Neck supple.     Right lower leg: No edema.     Left lower leg: No edema.  Skin:    General: Skin is warm and dry.     Capillary Refill: Capillary refill takes less than 2 seconds.  Neurological:     Mental Status: She is alert.  Psychiatric:        Mood and Affect: Mood normal.    ED Results / Procedures / Treatments   Labs (all labs ordered are listed, but only abnormal results are displayed) Labs Reviewed  CBC  COMPREHENSIVE METABOLIC PANEL WITH GFR  LIPASE, BLOOD  PREGNANCY, URINE  URINALYSIS, ROUTINE W REFLEX MICROSCOPIC  TROPONIN T, HIGH SENSITIVITY    EKG None  Radiology No results found.  Procedures Procedures    Medications Ordered in ED Medications  sodium chloride  0.9 % bolus 1,000 mL (has no administration in time range)  ketorolac  (TORADOL ) 15 MG/ML injection 15 mg (has no administration in time range)    ED Course/ Medical Decision Making/ A&P Clinical Course as of 11/12/23 0938  Thu Nov 12, 2023  0924 MRI 445 sx 720 [CR]    Clinical Course User Index [CR] Hanover Butter, PA                                 Medical Decision Making Amount and/or Complexity of Data Reviewed Labs: ordered. Radiology: ordered.  Risk Prescription drug management.   This patient presents to the ED for concern of chest tightness, nausea, vomiting, this involves an extensive number of treatment options, and is a complaint that carries with it a high risk of complications and morbidity.  The differential diagnosis includes allergic reaction, gastric redness, ACS, PE, pneumothorax, gastritis, PUD, cholecystitis, CBD pathology, other   Co morbidities that complicate the patient evaluation  See HPI   Additional history obtained:  Additional history obtained from EMR External records from  outside source obtained and reviewed including hospital records   Lab Tests:  I Ordered, and personally interpreted labs.  The pertinent results include: Troponin negative.  Urine pregnancy negative.  UA with Michele protein, Michele hemoglobin otherwise unremarkable.  No leukocytosis.  Anemia hemoglobin 10.4 which is near patient's baseline.  Platelets within range.  Mild decreased bicarb 20 otherwise, lites within normal limits.  No transaminitis.  No renal dysfunction.   Imaging Studies ordered:  I ordered imaging studies including chest x-ray I independently visualized and interpreted imaging which showed no acute pulm abnormality I agree  with the radiologist interpretation   Cardiac Monitoring: / EKG:  The patient was maintained on a cardiac monitor.  I personally viewed and interpreted the cardiac monitored which showed an underlying rhythm of: Sinus rhythm.  Nonspecific T wave changes.   Consultations Obtained:  N/a   Problem List / ED Course / Critical interventions / Medication management  Nausea, vomiting, chest pain I ordered medication including Toradol , many normal saline   Reevaluation of the patient after these medicines showed that the patient improved I have reviewed the patients home medicines and have made adjustments as needed   Social Determinants of Health:  Denies tobacco, illicit drug use.   Test / Admission - Considered:  Nausea, vomiting, chest pain Vitals signs significant for hypertension blood pressure 139/90. Otherwise within normal range and stable throughout visit. Laboratory/imaging studies significant for: See above 33 year old female presents emergency department with complaints of nausea, vomiting, chest pain, dizziness.  States that she had an MRI of her pelvis with contrast yesterday for further assessment regarding a fibroid.  MRI was performed around 445 when she received contrast.  States that she felt fine until around 720 yesterday  evening when she got home.  Reports development of central chest pain, feelings of nausea and then had 4-5 episodes of nonbloody emesis.  Denies any shortness of breath, rash, feelings of throat closing on her, difficulty breathing.  States she did not take anything for her symptoms.  States that the nausea as well as vomiting resolved today.  Has been tolerating p.o.  Does still report some chest pain centrally.  Denies any history of allergic reaction contrast before in the past. On exam, lungs clear to auscultation bilaterally.  Reproducible chest wall tenderness.  No abdominal tenderness.  Workup today reassuring.  Patient with negative troponin, nonspecific changes on EKG; low suspicion for ACS.  Patient with heart score of 0-3.  Patient did describe multiple episodes of emesis yesterday after receiving IV contrast but currently without any feelings of nausea reproducible abdominal tenderness.  Patient tolerating p.o. today.  Given 1 L normal saline Toradol  and did note improvement of symptoms.  Query possible allergic reaction from IV contrast given conglomeration of symptoms.  Patient elected to evaluate symptoms over the next 24 hours or so and if remain the same or worsening, will begin prednisone  in the outpatient setting as well as antihistamine therapy.  Will also send patient home with EpiPen  if symptoms concerning for anaphylaxis arise.  Will recommend close follow-up with PCP in the outpatient setting.  Treatment plan discussed with patient and she acknowledged understanding was agreeable to said plan.  Patient overall well-appearing, afebrile in no acute distress. Worrisome signs and symptoms were discussed with the patient, and the patient acknowledged understanding to return to the ED if noticed. Patient was stable upon discharge.          Final Clinical Impression(s) / ED Diagnoses Final diagnoses:  None    Rx / DC Orders ED Discharge Orders     None         [place] Butter, [place] 11/12/23 1241    Afton Horse T, DO 11/16/23 2250

## 2023-11-12 NOTE — Discharge Instructions (Signed)
 As discussed, your workup today was overall reassuring.  Chest x-ray, EKG and heart enzyme appeared normal.  The rest your lab test also appeared reassuring.  Will send you home with medicines to treat your symptoms as we discussed.  Please do not hesitate to return to the emergency department if the worrisome signs and symptoms we discussed become apparent.

## 2023-11-13 ENCOUNTER — Other Ambulatory Visit (HOSPITAL_BASED_OUTPATIENT_CLINIC_OR_DEPARTMENT_OTHER): Payer: Self-pay

## 2023-11-25 ENCOUNTER — Other Ambulatory Visit (HOSPITAL_BASED_OUTPATIENT_CLINIC_OR_DEPARTMENT_OTHER): Payer: Self-pay

## 2023-12-03 ENCOUNTER — Other Ambulatory Visit (HOSPITAL_BASED_OUTPATIENT_CLINIC_OR_DEPARTMENT_OTHER)

## 2023-12-04 ENCOUNTER — Other Ambulatory Visit (HOSPITAL_COMMUNITY): Payer: Self-pay | Admitting: Family Medicine

## 2023-12-04 ENCOUNTER — Telehealth (HOSPITAL_BASED_OUTPATIENT_CLINIC_OR_DEPARTMENT_OTHER): Payer: Self-pay | Admitting: Family Medicine

## 2023-12-04 ENCOUNTER — Other Ambulatory Visit (HOSPITAL_BASED_OUTPATIENT_CLINIC_OR_DEPARTMENT_OTHER)

## 2023-12-04 ENCOUNTER — Encounter (HOSPITAL_BASED_OUTPATIENT_CLINIC_OR_DEPARTMENT_OTHER): Payer: Self-pay | Admitting: Family Medicine

## 2023-12-04 DIAGNOSIS — E559 Vitamin D deficiency, unspecified: Secondary | ICD-10-CM

## 2023-12-04 NOTE — Telephone Encounter (Signed)
 Patient came in for labs and came to the front desk to inquire about a refill for her cholecalciferol  1.25 mg medication.  Please advise. Thank you!

## 2023-12-05 LAB — VITAMIN D 25 HYDROXY (VIT D DEFICIENCY, FRACTURES): Vit D, 25-Hydroxy: 58.7 ng/mL (ref 30.0–100.0)

## 2023-12-07 ENCOUNTER — Encounter: Payer: Self-pay | Admitting: Neurology

## 2023-12-07 ENCOUNTER — Other Ambulatory Visit (HOSPITAL_BASED_OUTPATIENT_CLINIC_OR_DEPARTMENT_OTHER): Payer: Self-pay

## 2023-12-07 ENCOUNTER — Ambulatory Visit (HOSPITAL_BASED_OUTPATIENT_CLINIC_OR_DEPARTMENT_OTHER): Payer: Self-pay | Admitting: Family Medicine

## 2023-12-07 ENCOUNTER — Ambulatory Visit: Payer: BC Managed Care – PPO | Admitting: Neurology

## 2023-12-07 MED ORDER — SENNOSIDES-DOCUSATE SODIUM 8.6-50 MG PO TABS
ORAL_TABLET | ORAL | 0 refills | Status: DC
  Filled 2023-12-07: qty 10, 10d supply, fill #0

## 2023-12-07 MED ORDER — IBUPROFEN 600 MG PO TABS
600.0000 mg | ORAL_TABLET | Freq: Four times a day (QID) | ORAL | 0 refills | Status: DC | PRN
  Filled 2023-12-07: qty 30, 8d supply, fill #0

## 2023-12-07 MED ORDER — ONDANSETRON 4 MG PO TBDP
ORAL_TABLET | ORAL | 0 refills | Status: DC
  Filled 2023-12-07: qty 2, 1d supply, fill #0

## 2023-12-07 MED ORDER — ACETAMINOPHEN 325 MG PO TABS
ORAL_TABLET | ORAL | 0 refills | Status: DC
  Filled 2023-12-07: qty 30, 4d supply, fill #0

## 2023-12-07 NOTE — Progress Notes (Signed)
 Hi Michele Pennington,  You may transition over to over the counter vitamin D3 at least 1000-2000 units daily with Calcium 600-800 units given the improvement in your vitamin D  level. Continue to eat a good balanced diet with sources of calcium and vitamin D  present. We will continue to monitor this level at least yearly.

## 2024-01-06 ENCOUNTER — Other Ambulatory Visit (HOSPITAL_BASED_OUTPATIENT_CLINIC_OR_DEPARTMENT_OTHER): Payer: Self-pay

## 2024-01-06 ENCOUNTER — Ambulatory Visit (HOSPITAL_BASED_OUTPATIENT_CLINIC_OR_DEPARTMENT_OTHER): Admitting: Family Medicine

## 2024-01-06 ENCOUNTER — Encounter (HOSPITAL_BASED_OUTPATIENT_CLINIC_OR_DEPARTMENT_OTHER): Payer: Self-pay | Admitting: Family Medicine

## 2024-01-06 VITALS — BP 152/94 | HR 83 | Ht 64.0 in | Wt 258.0 lb

## 2024-01-06 DIAGNOSIS — I10 Essential (primary) hypertension: Secondary | ICD-10-CM

## 2024-01-06 DIAGNOSIS — R102 Pelvic and perineal pain: Secondary | ICD-10-CM | POA: Diagnosis not present

## 2024-01-06 DIAGNOSIS — Z9109 Other allergy status, other than to drugs and biological substances: Secondary | ICD-10-CM

## 2024-01-06 LAB — BASIC METABOLIC PANEL WITH GFR
BUN/Creatinine Ratio: 12 (ref 9–23)
BUN: 11 mg/dL (ref 6–20)
CO2: 20 mmol/L (ref 20–29)
Calcium: 9.6 mg/dL (ref 8.7–10.2)
Chloride: 103 mmol/L (ref 96–106)
Creatinine, Ser: 0.92 mg/dL (ref 0.57–1.00)
Glucose: 98 mg/dL (ref 70–99)
Potassium: 4.5 mmol/L (ref 3.5–5.2)
Sodium: 143 mmol/L (ref 134–144)
eGFR: 84 mL/min/1.73 (ref >59)

## 2024-01-06 MED ORDER — HYDROCHLOROTHIAZIDE 25 MG PO TABS
25.0000 mg | ORAL_TABLET | Freq: Every day | ORAL | 1 refills | Status: AC
  Filled 2024-01-06: qty 30, 30d supply, fill #0

## 2024-01-06 MED ORDER — FLUTICASONE PROPIONATE 50 MCG/ACT NA SUSP
2.0000 | Freq: Every day | NASAL | 6 refills | Status: AC
  Filled 2024-01-06: qty 16, 30d supply, fill #0

## 2024-01-06 MED ORDER — CETIRIZINE HCL 10 MG PO TABS
10.0000 mg | ORAL_TABLET | Freq: Every day | ORAL | 11 refills | Status: DC
  Filled 2024-01-06: qty 30, 30d supply, fill #0

## 2024-01-06 MED ORDER — FLUCONAZOLE 150 MG PO TABS
ORAL_TABLET | ORAL | 0 refills | Status: DC
  Filled 2024-01-06: qty 2, 4d supply, fill #0

## 2024-01-06 MED ORDER — IBUPROFEN 800 MG PO TABS
800.0000 mg | ORAL_TABLET | Freq: Four times a day (QID) | ORAL | 0 refills | Status: DC | PRN
  Filled 2024-01-06: qty 30, 8d supply, fill #0

## 2024-01-06 NOTE — Patient Instructions (Addendum)
 Increase clear fluids.   Zyrtec  and Flonase  daily for the next 3 days. Call/message on Friday if no improvement.   Blood Pressure goal is less than 130/80. Stay Hydrated with medication.    You may transition over to over the counter vitamin D3 at least 1000-2000 units daily with Calcium 600-800 units given the improvement in your vitamin D  level. Continue to eat a good balanced diet with sources of calcium and vitamin D  present. We will continue to monitor this level at least yearly.
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
## 2024-01-07 ENCOUNTER — Ambulatory Visit (HOSPITAL_BASED_OUTPATIENT_CLINIC_OR_DEPARTMENT_OTHER): Payer: Self-pay | Admitting: Family Medicine

## 2024-01-07 NOTE — Progress Notes (Signed)
 BMP stable and renal function is stable. Can start hydrochlorothiazide  for BP control.

## 2024-01-11 ENCOUNTER — Other Ambulatory Visit (HOSPITAL_BASED_OUTPATIENT_CLINIC_OR_DEPARTMENT_OTHER): Payer: Self-pay

## 2024-01-11 MED ORDER — DOXYCYCLINE HYCLATE 100 MG PO TABS
ORAL_TABLET | ORAL | 0 refills | Status: DC
  Filled 2024-01-11: qty 20, 10d supply, fill #0

## 2024-01-14 ENCOUNTER — Emergency Department (HOSPITAL_BASED_OUTPATIENT_CLINIC_OR_DEPARTMENT_OTHER): Admitting: Radiology

## 2024-01-14 ENCOUNTER — Other Ambulatory Visit: Payer: Self-pay

## 2024-01-14 ENCOUNTER — Encounter (HOSPITAL_BASED_OUTPATIENT_CLINIC_OR_DEPARTMENT_OTHER): Payer: Self-pay | Admitting: *Deleted

## 2024-01-14 ENCOUNTER — Emergency Department (HOSPITAL_BASED_OUTPATIENT_CLINIC_OR_DEPARTMENT_OTHER)
Admission: EM | Admit: 2024-01-14 | Discharge: 2024-01-15 | Disposition: A | Discharge status: Home / Self Care | Attending: Emergency Medicine | Admitting: Emergency Medicine

## 2024-01-14 DIAGNOSIS — Z79899 Other long term (current) drug therapy: Secondary | ICD-10-CM | POA: Diagnosis not present

## 2024-01-14 DIAGNOSIS — I10 Essential (primary) hypertension: Secondary | ICD-10-CM | POA: Insufficient documentation

## 2024-01-14 DIAGNOSIS — R0789 Other chest pain: Secondary | ICD-10-CM | POA: Diagnosis present

## 2024-01-14 LAB — CBC
HCT: 35.8 % — ABNORMAL LOW (ref 36.0–46.0)
Hemoglobin: 11.6 g/dL — ABNORMAL LOW (ref 12.0–15.0)
MCH: 26.2 pg (ref 26.0–34.0)
MCHC: 32.4 g/dL (ref 30.0–36.0)
MCV: 80.8 fL (ref 80.0–100.0)
Platelets: 366 K/uL (ref 150–400)
RBC: 4.43 MIL/uL (ref 3.87–5.11)
RDW: 13.3 % (ref 11.5–15.5)
WBC: 7.9 K/uL (ref 4.0–10.5)
nRBC: 0 % (ref 0.0–0.2)

## 2024-01-14 LAB — BASIC METABOLIC PANEL WITH GFR
Anion gap: 13 (ref 5–15)
BUN: 12 mg/dL (ref 6–20)
CO2: 23 mmol/L (ref 22–32)
Calcium: 9.8 mg/dL (ref 8.9–10.3)
Chloride: 104 mmol/L (ref 98–111)
Creatinine, Ser: 1.02 mg/dL — ABNORMAL HIGH (ref 0.44–1.00)
GFR, Estimated: >60 mL/min (ref >60)
Glucose, Bld: 139 mg/dL — ABNORMAL HIGH (ref 70–99)
Potassium: 3.5 mmol/L (ref 3.5–5.1)
Sodium: 140 mmol/L (ref 135–145)

## 2024-01-14 LAB — TROPONIN T, HIGH SENSITIVITY: Troponin T High Sensitivity: <15 ng/L (ref <19)

## 2024-01-14 MED ORDER — KETOROLAC TROMETHAMINE 30 MG/ML IJ SOLN
30.0000 mg | Freq: Once | INTRAMUSCULAR | Status: AC
  Administered 2024-01-15: 30 mg via INTRAVENOUS
  Filled 2024-01-14: qty 1

## 2024-01-14 NOTE — ED Provider Notes (Signed)
 [place] EMERGENCY DEPARTMENT AT [facility] Provider Note   CSN: [id number] Arrival date & time: 01/14/24  2213     Patient presents with: Chest Pain   Michele Pennington is a 33 y.o. female.  {Add pertinent medical, surgical, social history, OB history to HPI:32947} HPI     This is a 33 year old female who presents with chest pain.  Patient reports 24-hour history of chest pain.  It is mostly right-sided and sharp at times.  It is worse with certain movements and moving her arm over her head.  Denies any injury or heavy lifting.  Initially thought it may be related to reflux but did not have any improvement with reflux medications.  No shortness of breath or fevers.  Has a history of high blood pressure but not on any medications.  No history of heart disease.  Denies leg swelling or history of clots.  Prior to Admission medications   Medication Sig Start Date End Date Taking? Authorizing Provider  cetirizine  (ZYRTEC ) 10 MG tablet Take 1 tablet (10 mg total) by mouth daily. 01/06/24   Caudle, Thersia Bitters, FNP  Cholecalciferol  1.25 MG (50000 UT) capsule Take 1 capsule (50,000 Units total) by mouth once a week. 09/01/23   Caudle, Thersia Bitters, FNP  doxycycline  (VIBRA -TABS) 100 MG tablet Take 1 tablet (100 mg total) by mouth 2 (two) times a day for 10 days. Take with 8 oz water. Do not lie down for at least 30 minutes after. 01/11/24     ferrous sulfate  325 (65 FE) MG tablet Take 1 tablet (325 mg total) by mouth daily. 07/02/23   Caudle, Thersia Bitters, FNP  fluconazole  (DIFLUCAN ) 150 MG tablet Take 1 tablet by mouth. Take additional tablet in 72 hours if symptoms do not improve. 01/06/24     fluticasone  (FLONASE ) 50 MCG/ACT nasal spray Place 2 sprays into both nostrils daily. 01/06/24   Caudle, Thersia Bitters, FNP  hydrochlorothiazide  (HYDRODIURIL ) 25 MG tablet Take 1 tablet (25 mg total) by mouth daily. 01/06/24   Caudle, Thersia Bitters, FNP  ibuprofen  (ADVIL ) 600 MG tablet Take 1  tablet (600 mg total) by mouth every 6 (six) hours as needed for mild pain.(1-3) 12/07/23     ibuprofen  (ADVIL ) 800 MG tablet Take 1 tablet (800 mg total) by mouth every 6 (six) hours as needed for moderate pain (4-6). 01/06/24     norethindrone  (AYGESTIN ) 5 MG tablet Take 1 tablet by mouth three times daily until bleeding stops then decrease to once daily Patient not taking: Reported on 01/06/2024 07/29/23   Lo, Arland POUR, CNM    Allergies: Patient has no known allergies.    Review of Systems  Constitutional:  Negative for fever.  Respiratory:  Negative for shortness of breath.   Cardiovascular:  Positive for chest pain. Negative for leg swelling.  Gastrointestinal:  Negative for abdominal pain.  All other systems reviewed and are negative.   Updated Vital Signs BP (!) 166/102 (BP Location: Left Arm)   Pulse 84   Temp 97.8 F (36.6 C)   Resp 18   LMP 01/03/2024 (Exact Date)   SpO2 99%   Physical Exam Vitals and nursing note reviewed.  Constitutional:      Appearance: She is well-developed. She is obese.  HENT:     Head: Normocephalic and atraumatic.  Eyes:     Pupils: Pupils are equal, round, and reactive to light.  Cardiovascular:     Rate and Rhythm: Normal rate and regular rhythm.  Heart sounds: Normal heart sounds.  Pulmonary:     Effort: Pulmonary effort is normal. No respiratory distress.     Breath sounds: No wheezing.  Chest:     Chest wall: Tenderness present.     Comments: Right-sided chest wall tenderness palpation, no skin changes or redness noted Abdominal:     General: Bowel sounds are normal.     Palpations: Abdomen is soft.  Musculoskeletal:     Cervical back: Neck supple.  Skin:    General: Skin is warm and dry.  Neurological:     Mental Status: She is alert and oriented to person, place, and time.  Psychiatric:        Mood and Affect: Mood normal.     (all labs ordered are listed, but only abnormal results are displayed) Labs Reviewed  BASIC  METABOLIC PANEL WITH GFR - Abnormal; Notable for the following components:      Result Value   Glucose, Bld 139 (*)    Creatinine, Ser 1.02 (*)    All other components within normal limits  CBC - Abnormal; Notable for the following components:   Hemoglobin 11.6 (*)    HCT 35.8 (*)    All other components within normal limits  PREGNANCY, URINE  TROPONIN T, HIGH SENSITIVITY    EKG: EKG Interpretation Date/Time:  Thursday January 14 2024 22:26:18 EDT Ventricular Rate:  88 PR Interval:  162 QRS Duration:  80 QT Interval:  396 QTC Calculation: 479 R Axis:   82  Text Interpretation: Normal sinus rhythm Normal ECG When compared with ECG of 12-Nov-2023 09:09, No significant change was found Confirmed by Bari Pfeiffer ([id number]) on 01/14/2024 11:17:34 PM  Radiology: [facility] Chest 2 View Result Date: 01/14/2024 CLINICAL DATA:  Chest pain short of breath EXAM: CHEST - 2 VIEW COMPARISON:  11/12/2023 FINDINGS: The heart size and mediastinal contours are within normal limits. Both lungs are clear. The visualized skeletal structures are unremarkable. IMPRESSION: No active cardiopulmonary disease. Electronically Signed   By: Luke Bun M.D.   On: 01/14/2024 23:15    {Document cardiac monitor, telemetry assessment procedure when appropriate:32947} Procedures   Medications Ordered in the ED  ketorolac  (TORADOL ) 30 MG/ML injection 30 mg (has no administration in time range)      {Click here for ABCD2, HEART and other calculators REFRESH Note before signing:1}                              Medical Decision Making Amount and/or Complexity of Data Reviewed Labs: ordered. Radiology: ordered.  Risk Prescription drug management.   ***  {Document critical care time when appropriate  Document review of labs and clinical decision tools ie CHADS2VASC2, etc  Document your independent review of radiology images and any outside records  Document your discussion with family members, caretakers and with  consultants  Document social determinants of health affecting pt's care  Document your decision making why or why not admission, treatments were needed:32947:::1}   Final diagnoses:  None    ED Discharge Orders     None

## 2024-01-14 NOTE — ED Triage Notes (Addendum)
 Right sdied chest pain since yesterday with shortness of breath reported. OTC medication for heart burn have not helped. Movement increases the pain. Patient reports decreased strength in the right arm. No changes to right leg.   Patient reports a similar event with arm weakness and MRI performed and MD dx migraines. No headache or migraine today.

## 2024-01-15 ENCOUNTER — Other Ambulatory Visit (HOSPITAL_BASED_OUTPATIENT_CLINIC_OR_DEPARTMENT_OTHER): Payer: Self-pay

## 2024-01-15 LAB — TROPONIN T, HIGH SENSITIVITY: Troponin T High Sensitivity: <15 ng/L (ref <19)

## 2024-01-15 LAB — PREGNANCY, URINE: Preg Test, Ur: NEGATIVE

## 2024-01-15 MED ORDER — IBUPROFEN 600 MG PO TABS
600.0000 mg | ORAL_TABLET | Freq: Four times a day (QID) | ORAL | 0 refills | Status: DC | PRN
  Filled 2024-01-15: qty 30, 8d supply, fill #0

## 2024-01-15 NOTE — Discharge Instructions (Signed)
 You were seen today for chest pain.  Your workup is reassuring including heart testing.  Start ibuprofen  consistently for the next 3 to 5 days to see if this improves.  Follow-up with your primary doctor.

## 2024-01-15 NOTE — Telephone Encounter (Signed)
 Patient is calling to reschedule pre op appointment for today. Please call to reschedule

## 2024-01-15 NOTE — Telephone Encounter (Signed)
 VM left for pt with new pre op appt time and date

## 2024-01-21 ENCOUNTER — Other Ambulatory Visit (HOSPITAL_BASED_OUTPATIENT_CLINIC_OR_DEPARTMENT_OTHER): Payer: Self-pay

## 2024-01-28 ENCOUNTER — Other Ambulatory Visit (HOSPITAL_BASED_OUTPATIENT_CLINIC_OR_DEPARTMENT_OTHER): Payer: Self-pay

## 2024-01-28 MED ORDER — DOXYCYCLINE HYCLATE 100 MG PO TABS
100.0000 mg | ORAL_TABLET | Freq: Two times a day (BID) | ORAL | 0 refills | Status: DC
  Filled 2024-01-28: qty 20, 10d supply, fill #0

## 2024-01-28 NOTE — Telephone Encounter (Signed)
 Patient calling states she is supposed to receive a script for a medication in relation to the inflammation of her uterus and she states that the pharmacy still does not have this. She would like to check on the status of this being called in and requests a call back from the nurse/provider. Please advise.

## 2024-02-15 ENCOUNTER — Emergency Department (HOSPITAL_BASED_OUTPATIENT_CLINIC_OR_DEPARTMENT_OTHER)

## 2024-02-15 ENCOUNTER — Encounter (HOSPITAL_BASED_OUTPATIENT_CLINIC_OR_DEPARTMENT_OTHER): Payer: Self-pay | Admitting: Emergency Medicine

## 2024-02-15 ENCOUNTER — Other Ambulatory Visit: Payer: Self-pay

## 2024-02-15 ENCOUNTER — Observation Stay (HOSPITAL_BASED_OUTPATIENT_CLINIC_OR_DEPARTMENT_OTHER)
Admission: EM | Admit: 2024-02-15 | Discharge: 2024-02-19 | Disposition: A | Discharge status: Home / Self Care | Attending: Family Medicine | Admitting: Family Medicine

## 2024-02-15 DIAGNOSIS — D500 Iron deficiency anemia secondary to blood loss (chronic): Secondary | ICD-10-CM | POA: Insufficient documentation

## 2024-02-15 DIAGNOSIS — D259 Leiomyoma of uterus, unspecified: Secondary | ICD-10-CM | POA: Insufficient documentation

## 2024-02-15 DIAGNOSIS — Z79899 Other long term (current) drug therapy: Secondary | ICD-10-CM | POA: Insufficient documentation

## 2024-02-15 DIAGNOSIS — R102 Pelvic and perineal pain: Secondary | ICD-10-CM | POA: Diagnosis not present

## 2024-02-15 DIAGNOSIS — R202 Paresthesia of skin: Secondary | ICD-10-CM | POA: Diagnosis not present

## 2024-02-15 DIAGNOSIS — Z90710 Acquired absence of both cervix and uterus: Secondary | ICD-10-CM | POA: Diagnosis not present

## 2024-02-15 DIAGNOSIS — Z6841 Body Mass Index (BMI) 40.0 and over, adult: Secondary | ICD-10-CM | POA: Insufficient documentation

## 2024-02-15 DIAGNOSIS — E66813 Obesity, class 3: Secondary | ICD-10-CM | POA: Insufficient documentation

## 2024-02-15 DIAGNOSIS — N939 Abnormal uterine and vaginal bleeding, unspecified: Secondary | ICD-10-CM | POA: Insufficient documentation

## 2024-02-15 DIAGNOSIS — N83291 Other ovarian cyst, right side: Secondary | ICD-10-CM | POA: Diagnosis not present

## 2024-02-15 DIAGNOSIS — R739 Hyperglycemia, unspecified: Secondary | ICD-10-CM | POA: Insufficient documentation

## 2024-02-15 DIAGNOSIS — R109 Unspecified abdominal pain: Secondary | ICD-10-CM | POA: Diagnosis present

## 2024-02-15 DIAGNOSIS — R9389 Abnormal findings on diagnostic imaging of other specified body structures: Secondary | ICD-10-CM | POA: Insufficient documentation

## 2024-02-15 DIAGNOSIS — I10 Essential (primary) hypertension: Secondary | ICD-10-CM | POA: Diagnosis not present

## 2024-02-15 LAB — PREGNANCY, URINE: Preg Test, Ur: NEGATIVE

## 2024-02-15 LAB — CBC WITH DIFFERENTIAL/PLATELET
Abs Immature Granulocytes: 0.02 K/uL (ref 0.00–0.07)
Basophils Absolute: 0.1 K/uL (ref 0.0–0.1)
Basophils Relative: 1 %
Eosinophils Absolute: 0.1 K/uL (ref 0.0–0.5)
Eosinophils Relative: 2 %
HCT: 35.2 % — ABNORMAL LOW (ref 36.0–46.0)
Hemoglobin: 11.4 g/dL — ABNORMAL LOW (ref 12.0–15.0)
Immature Granulocytes: 0 %
Lymphocytes Relative: 15 %
Lymphs Abs: 1.3 K/uL (ref 0.7–4.0)
MCH: 26 pg (ref 26.0–34.0)
MCHC: 32.4 g/dL (ref 30.0–36.0)
MCV: 80.2 fL (ref 80.0–100.0)
Monocytes Absolute: 0.8 K/uL (ref 0.1–1.0)
Monocytes Relative: 9 %
Neutro Abs: 6.6 K/uL (ref 1.7–7.7)
Neutrophils Relative %: 73 %
Platelets: 383 K/uL (ref 150–400)
RBC: 4.39 MIL/uL (ref 3.87–5.11)
RDW: 13.9 % (ref 11.5–15.5)
WBC: 9 K/uL (ref 4.0–10.5)
nRBC: 0 % (ref 0.0–0.2)

## 2024-02-15 LAB — COMPREHENSIVE METABOLIC PANEL WITH GFR
ALT: 7 U/L (ref 0–44)
AST: 16 U/L (ref 15–41)
Albumin: 4.2 g/dL (ref 3.5–5.0)
Alkaline Phosphatase: 49 U/L (ref 38–126)
Anion gap: 12 (ref 5–15)
BUN: 12 mg/dL (ref 6–20)
CO2: 23 mmol/L (ref 22–32)
Calcium: 9.8 mg/dL (ref 8.9–10.3)
Chloride: 104 mmol/L (ref 98–111)
Creatinine, Ser: 0.97 mg/dL (ref 0.44–1.00)
GFR, Estimated: >60 mL/min (ref >60)
Glucose, Bld: 112 mg/dL — ABNORMAL HIGH (ref 70–99)
Potassium: 3.6 mmol/L (ref 3.5–5.1)
Sodium: 139 mmol/L (ref 135–145)
Total Bilirubin: 0.2 mg/dL (ref 0.0–1.2)
Total Protein: 7.5 g/dL (ref 6.5–8.1)

## 2024-02-15 LAB — URINALYSIS, ROUTINE W REFLEX MICROSCOPIC
Bacteria, UA: NONE SEEN
Bilirubin Urine: NEGATIVE
Glucose, UA: NEGATIVE mg/dL
Ketones, ur: 15 mg/dL — AB
Nitrite: NEGATIVE
Protein, ur: NEGATIVE mg/dL
RBC / HPF: >50 RBC/hpf (ref 0–5)
Specific Gravity, Urine: 1.025 (ref 1.005–1.030)
pH: 7 (ref 5.0–8.0)

## 2024-02-15 LAB — LIPASE, BLOOD: Lipase: 22 U/L (ref 11–51)

## 2024-02-15 MED ORDER — SODIUM CHLORIDE 0.9 % IV BOLUS
1000.0000 mL | Freq: Once | INTRAVENOUS | Status: AC
  Administered 2024-02-15: 1000 mL via INTRAVENOUS

## 2024-02-15 MED ORDER — DOXYCYCLINE HYCLATE 100 MG PO CAPS: 100.0000 mg | ORAL_CAPSULE | Freq: Two times a day (BID) | ORAL | 0 refills | Status: DC

## 2024-02-15 MED ORDER — PROGESTERONE 200 MG PO CAPS: 200.0000 mg | ORAL_CAPSULE | Freq: Every day | ORAL | 0 refills | Status: DC

## 2024-02-15 MED ORDER — IOHEXOL 300 MG/ML  SOLN
100.0000 mL | Freq: Once | INTRAMUSCULAR | Status: AC | PRN
  Administered 2024-02-15: 100 mL via INTRAVENOUS

## 2024-02-15 MED ORDER — FENTANYL CITRATE PF 50 MCG/ML IJ SOSY
50.0000 ug | PREFILLED_SYRINGE | Freq: Once | INTRAMUSCULAR | Status: AC
  Administered 2024-02-15: 50 ug via INTRAVENOUS
  Filled 2024-02-15: qty 1

## 2024-02-15 MED ORDER — ONDANSETRON HCL 4 MG/2ML IJ SOLN
4.0000 mg | Freq: Once | INTRAMUSCULAR | Status: AC
  Administered 2024-02-15: 4 mg via INTRAVENOUS
  Filled 2024-02-15: qty 2

## 2024-02-15 MED ORDER — OXYCODONE-ACETAMINOPHEN 5-325 MG PO TABS
1.0000 | ORAL_TABLET | Freq: Four times a day (QID) | ORAL | 0 refills | Status: DC | PRN
Start: 2024-02-15 — End: 2024-02-18

## 2024-02-15 MED ORDER — OXYCODONE-ACETAMINOPHEN 5-325 MG PO TABS
2.0000 | ORAL_TABLET | Freq: Once | ORAL | Status: AC
  Administered 2024-02-15: 2 via ORAL
  Filled 2024-02-15: qty 2

## 2024-02-15 NOTE — ED Triage Notes (Signed)
 Lower abdo cramping and bleeding x 3 days Hx fibroids worse with cycle Surgery schedule in sept  Vomiting unable to keep anything down including meds Feels like when I was in labor  Ems VS 174/94 Hr 96 20rr 99% RA  Cbg 133

## 2024-02-15 NOTE — Discharge Instructions (Signed)
 You are seen in the emergency department and your CT imaging and ultrasound imaging showed evidence of significant burden of uterine fibroids which is likely the cause of your pain today.  Your OB/GYN was consulted and recommended close follow-up in clinic in the next day or so with outpatient pain medication. IMPRESSION:  1. Multiple uterine fibroids. The largest is in the right uterine  fundus and measures 5.4 x 4.5 x 4.3 cm. It is unclear if this is a  intramural fibroid indenting the endometrium or a submucosal  fibroid.  2. Thickened endometrium measuring 23 mm. If bleeding remains  unresponsive to hormonal or medical therapy, focal lesion work-up  with sonohysterogram should be considered. Endometrial biopsy should  also be considered in pre-menopausal patients at high risk for  endometrial carcinoma. (Ref: Radiological Reasoning: Algorithmic  Workup of Abnormal Vaginal Bleeding with Endovaginal Sonography and  Sonohysterography. AJR 2008; 808:D31-26)  3. Heterogenous avascular complex cystic mass in the right ovary  measuring 3.7 cm. This is indeterminate may represent a hemorrhagic  cyst. Recommend follow-up ultrasound in 6-12 weeks.  4. No evidence of ovarian torsion.

## 2024-02-15 NOTE — Plan of Care (Addendum)
 Plan of Care Note for accepted transfer  Patient: Michele Pennington    FMW:[id number]  DOA: 02/15/2024     Facility requesting transfer: [facility]  Requesting Provider: Jerrol Agent, MD  Reason for transfer: Fibroid uterus / hemorrhagic ovarian cyst, N/V  Facility course:   35 F hx of P/w pelvic pain, cramping, intolerance of PO, few days of vaginal bleeding. Hb 11, stable from last mo. hcg neg. Pelvic US  with uterine fibroid, thickened endometrium at 2.3 cm, and ? hemorrhagic ovarian cyst on the R.  EDP D/w Gyn felt no reason for admission / declined.  Had recommended for Progesterone  200 mg daily, And for docycycline in case could have endometritis. Still with intractable pain + patient does not feel comfortable with discharge home. Will bring in for obs, pain control, serial blood count   Plan of care: The patient is accepted for admission to Med-surg  unit, at [facility].    Author: Dorn Dawson, MD  02/15/2024  Check [email or web address] for on-call coverage.  Nursing staff, Please call [facility] Admits & Consults System-Wide number on Amion as soon as patient's arrival, so appropriate admitting provider can evaluate the pt.

## 2024-02-15 NOTE — ED Provider Notes (Signed)
 [place] EMERGENCY DEPARTMENT AT [facility] Provider Note   CSN: [id number] Arrival date & time: 02/15/24  1610     Patient presents with: Abdominal Pain   Michele Pennington is a 33 y.o. female.  {Add pertinent medical, surgical, social history, OB history to HPI:32947}  Abdominal Pain      Prior to Admission medications   Medication Sig Start Date End Date Taking? Authorizing Provider  cetirizine  (ZYRTEC ) 10 MG tablet Take 1 tablet (10 mg total) by mouth daily. 01/06/24   Caudle, Thersia Bitters, FNP  Cholecalciferol  1.25 MG (50000 UT) capsule Take 1 capsule (50,000 Units total) by mouth once a week. 09/01/23   Caudle, Thersia Bitters, FNP  doxycycline  (VIBRA -TABS) 100 MG tablet Take 1 tablet (100 mg total) by mouth 2 (two) times a day for 10 days. Take with 8 oz water. Do not lie down for at least 30 minutes after. 01/11/24     doxycycline  (VIBRA -TABS) 100 MG tablet Take 1 tablet (100 mg total) by mouth 2 (two) times a day for 10 days. Take with 8 oz water. Do not lie down for at least 30 minutes after. 01/28/24     ferrous sulfate  325 (65 FE) MG tablet Take 1 tablet (325 mg total) by mouth daily. 07/02/23   Caudle, Thersia Bitters, FNP  fluconazole  (DIFLUCAN ) 150 MG tablet Take 1 tablet by mouth. Take additional tablet in 72 hours if symptoms do not improve. 01/06/24     fluticasone  (FLONASE ) 50 MCG/ACT nasal spray Place 2 sprays into both nostrils daily. 01/06/24   Caudle, Thersia Bitters, FNP  hydrochlorothiazide  (HYDRODIURIL ) 25 MG tablet Take 1 tablet (25 mg total) by mouth daily. 01/06/24   Caudle, Thersia Bitters, FNP  ibuprofen  (ADVIL ) 600 MG tablet Take 1 tablet (600 mg total) by mouth every 6 (six) hours as needed. 01/15/24   Horton, Charmaine FALCON, MD  ibuprofen  (ADVIL ) 800 MG tablet Take 1 tablet (800 mg total) by mouth every 6 (six) hours as needed for moderate pain (4-6). 01/06/24     norethindrone  (AYGESTIN ) 5 MG tablet Take 1 tablet by mouth three times daily until bleeding  stops then decrease to once daily Patient not taking: Reported on 01/06/2024 07/29/23   Lo, Arland POUR, CNM    Allergies: Other    Review of Systems  Gastrointestinal:  Positive for abdominal pain.    Updated Vital Signs BP (!) 153/97 (BP Location: Right Arm)   Pulse 87   Temp 99.5 F (37.5 C) (Oral)   Resp 20   SpO2 100%   Physical Exam  (all labs ordered are listed, but only abnormal results are displayed) Labs Reviewed - No data to display  EKG: None  Radiology: No results found.  {Document cardiac monitor, telemetry assessment procedure when appropriate:32947} Procedures   Medications Ordered in the ED - No data to display    {Click here for ABCD2, HEART and other calculators REFRESH Note before signing:1}                              Medical Decision Making Amount and/or Complexity of Data Reviewed Labs: ordered. Radiology: ordered.  Risk Prescription drug management. Decision regarding hospitalization.   ***  Pelvic US : IMPRESSION:  1. Multiple uterine fibroids. The largest is in the right uterine  fundus and measures 5.4 x 4.5 x 4.3 cm. It is unclear if this is a  intramural fibroid indenting the endometrium or a submucosal  fibroid.  2. Thickened endometrium measuring 23 mm. If bleeding remains  unresponsive to hormonal or medical therapy, focal lesion work-up  with sonohysterogram should be considered. Endometrial biopsy should  also be considered in pre-menopausal patients at high risk for  endometrial carcinoma. (Ref: Radiological Reasoning: Algorithmic  Workup of Abnormal Vaginal Bleeding with Endovaginal Sonography and  Sonohysterography. AJR 2008; 808:D31-26)  3. Heterogenous avascular complex cystic mass in the right ovary  measuring 3.7 cm. This is indeterminate may represent a hemorrhagic  cyst. Recommend follow-up ultrasound in 6-12 weeks.  4. No evidence of ovarian torsion.    CT Abd Pelvis:  IMPRESSION:  Fibroid uterus. Markedly  abnormal, thickened and heterogeneous  endometrium measuring up to 5 cm in thickness. Recommend further  evaluation with pelvic ultrasound.    3.2 cm right ovarian cystic lesion. Recommend attention on pelvic  ultrasound.     {Document critical care time when appropriate  Document review of labs and clinical decision tools ie CHADS2VASC2, etc  Document your independent review of radiology images and any outside records  Document your discussion with family members, caretakers and with consultants  Document social determinants of health affecting pt's care  Document your decision making why or why not admission, treatments were needed:32947:::1}   Final diagnoses:  None    ED Discharge Orders     None

## 2024-02-16 ENCOUNTER — Encounter (HOSPITAL_COMMUNITY): Payer: Self-pay | Admitting: Family Medicine

## 2024-02-16 DIAGNOSIS — N83299 Other ovarian cyst, unspecified side: Secondary | ICD-10-CM

## 2024-02-16 DIAGNOSIS — R9389 Abnormal findings on diagnostic imaging of other specified body structures: Secondary | ICD-10-CM

## 2024-02-16 DIAGNOSIS — R102 Pelvic and perineal pain: Secondary | ICD-10-CM | POA: Diagnosis present

## 2024-02-16 DIAGNOSIS — Z743 Need for continuous supervision: Secondary | ICD-10-CM | POA: Diagnosis not present

## 2024-02-16 DIAGNOSIS — D259 Leiomyoma of uterus, unspecified: Secondary | ICD-10-CM | POA: Diagnosis not present

## 2024-02-16 DIAGNOSIS — N939 Abnormal uterine and vaginal bleeding, unspecified: Secondary | ICD-10-CM | POA: Diagnosis present

## 2024-02-16 DIAGNOSIS — R1084 Generalized abdominal pain: Secondary | ICD-10-CM | POA: Diagnosis not present

## 2024-02-16 LAB — VITAMIN B12: Vitamin B-12: 248 pg/mL (ref 180–914)

## 2024-02-16 LAB — COMPREHENSIVE METABOLIC PANEL WITH GFR
ALT: 11 U/L (ref 0–44)
AST: 14 U/L — ABNORMAL LOW (ref 15–41)
Albumin: 3.3 g/dL — ABNORMAL LOW (ref 3.5–5.0)
Alkaline Phosphatase: 38 U/L (ref 38–126)
Anion gap: 5 (ref 5–15)
BUN: 8 mg/dL (ref 6–20)
CO2: 23 mmol/L (ref 22–32)
Calcium: 8.8 mg/dL — ABNORMAL LOW (ref 8.9–10.3)
Chloride: 108 mmol/L (ref 98–111)
Creatinine, Ser: 0.88 mg/dL (ref 0.44–1.00)
GFR, Estimated: >60 mL/min (ref >60)
Glucose, Bld: 95 mg/dL (ref 70–99)
Potassium: 3.5 mmol/L (ref 3.5–5.1)
Sodium: 136 mmol/L (ref 135–145)
Total Bilirubin: 0.4 mg/dL (ref 0.0–1.2)
Total Protein: 6.7 g/dL (ref 6.5–8.1)

## 2024-02-16 LAB — CBC
HCT: 34.5 % — ABNORMAL LOW (ref 36.0–46.0)
Hemoglobin: 11.2 g/dL — ABNORMAL LOW (ref 12.0–15.0)
MCH: 25.9 pg — ABNORMAL LOW (ref 26.0–34.0)
MCHC: 32.5 g/dL (ref 30.0–36.0)
MCV: 79.7 fL — ABNORMAL LOW (ref 80.0–100.0)
Platelets: 341 K/uL (ref 150–400)
RBC: 4.33 MIL/uL (ref 3.87–5.11)
RDW: 14.1 % (ref 11.5–15.5)
WBC: 8.4 K/uL (ref 4.0–10.5)
nRBC: 0 % (ref 0.0–0.2)

## 2024-02-16 LAB — HIV ANTIBODY (ROUTINE TESTING W REFLEX): HIV Screen 4th Generation wRfx: NONREACTIVE

## 2024-02-16 LAB — HEMOGLOBIN A1C
Hgb A1c MFr Bld: 5.6 % (ref 4.8–5.6)
Mean Plasma Glucose: 114.02 mg/dL

## 2024-02-16 LAB — TSH: TSH: 2.568 u[IU]/mL (ref 0.350–4.500)

## 2024-02-16 MED ORDER — ACETAMINOPHEN 500 MG PO TABS
1000.0000 mg | ORAL_TABLET | Freq: Three times a day (TID) | ORAL | Status: DC
  Administered 2024-02-16 – 2024-02-18 (×8): 1000 mg via ORAL
  Filled 2024-02-16 (×8): qty 2

## 2024-02-16 MED ORDER — PROGESTERONE 200 MG PO CAPS
200.0000 mg | ORAL_CAPSULE | Freq: Every day | ORAL | Status: DC
  Administered 2024-02-16: 200 mg via ORAL
  Filled 2024-02-16: qty 1

## 2024-02-16 MED ORDER — KETOROLAC TROMETHAMINE 15 MG/ML IJ SOLN
15.0000 mg | Freq: Four times a day (QID) | INTRAMUSCULAR | Status: DC
  Administered 2024-02-16: 15 mg via INTRAVENOUS
  Filled 2024-02-16: qty 1

## 2024-02-16 MED ORDER — TRAMADOL HCL 50 MG PO TABS
100.0000 mg | ORAL_TABLET | Freq: Four times a day (QID) | ORAL | Status: DC
  Administered 2024-02-16 – 2024-02-18 (×7): 100 mg via ORAL
  Filled 2024-02-16 (×7): qty 2

## 2024-02-16 MED ORDER — OXYCODONE HCL 5 MG PO TABS
5.0000 mg | ORAL_TABLET | ORAL | Status: DC | PRN
  Administered 2024-02-16 – 2024-02-17 (×2): 5 mg via ORAL
  Filled 2024-02-16 (×2): qty 1

## 2024-02-16 MED ORDER — DOXYCYCLINE HYCLATE 100 MG PO TABS
100.0000 mg | ORAL_TABLET | Freq: Two times a day (BID) | ORAL | Status: DC
  Administered 2024-02-16 – 2024-02-18 (×6): 100 mg via ORAL
  Filled 2024-02-16 (×6): qty 1

## 2024-02-16 MED ORDER — KETOROLAC TROMETHAMINE 15 MG/ML IJ SOLN
15.0000 mg | Freq: Four times a day (QID) | INTRAMUSCULAR | Status: DC
  Administered 2024-02-16 – 2024-02-17 (×3): 15 mg via INTRAVENOUS
  Filled 2024-02-16 (×3): qty 1

## 2024-02-16 MED ORDER — NORETHINDRONE ACETATE 5 MG PO TABS
5.0000 mg | ORAL_TABLET | Freq: Two times a day (BID) | ORAL | Status: DC
  Administered 2024-02-16 – 2024-02-19 (×6): 5 mg via ORAL
  Filled 2024-02-16 (×7): qty 1
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
## 2024-02-16 NOTE — Progress Notes (Signed)
   Patient seen and examined at bedside, patient admitted after midnight, please see earlier detailed admission note by A Meliton Perri Raddle., MD. Briefly, patient presented abdominal/pelvic pain with vaginal bleeding in setting of known uterine fibroids. Gyn curbsided overnight and recommended progesterone  and pain management.  Subjective: No pain this morning. Concerned her pain might increase as the day continues. Wanting to see gynecology to discuss recommendations and limitations of obtaining biopsy.  BP 133/78 (BP Location: Left Arm)   Pulse 68   Temp 98.5 F (36.9 C) (Oral)   Resp 16   Ht 5' 3 (1.6 m)   Wt 116.6 kg   SpO2 100%   BMI 45.54 kg/m   General exam: Appears calm and comfortable Respiratory system: Clear to auscultation. Respiratory effort normal. Cardiovascular system: S1 & S2 heard, RRR. No murmurs, rubs, gallops or clicks. Gastrointestinal system: Abdomen is nondistended, soft and nontender. Normal bowel sounds heard. Central nervous system: Alert and oriented. No focal neurological deficits. Psychiatry: Judgement and insight appear normal. Mood & affect appropriate.   Brief assessment/Plan:  Abdominal/pelvic pain Vaginal bleeding Secondary to known fibroid uterus. Patient started on scheduled Toradol  and progesterone  -Continue Toradol  IV and progesterone  -Formally consult gynecology, Dr. Glennon  Right ovarian cyst Noted on imaging.   Thickened endometrium Will need outpatient biopsy, per gynecology. Patient requesting inpatient consultation with gynecology at this time.  Primary hypertension Hyperglycemia Paresthesias Anemia Hyperglycemia Per H&P  Family communication: None at bedside DVT prophylaxis: SCDs Disposition: Discharge likely in 24 hours if pain better controlled.  Michele Lam, MD [facility] 02/16/2024, 12:22 PM

## 2024-02-16 NOTE — Plan of Care (Signed)

## 2024-02-17 ENCOUNTER — Telehealth (HOSPITAL_BASED_OUTPATIENT_CLINIC_OR_DEPARTMENT_OTHER): Payer: Self-pay

## 2024-02-17 DIAGNOSIS — R102 Pelvic and perineal pain: Secondary | ICD-10-CM | POA: Diagnosis not present

## 2024-02-17 LAB — CBC
HCT: 34.8 % — ABNORMAL LOW (ref 36.0–46.0)
Hemoglobin: 11.1 g/dL — ABNORMAL LOW (ref 12.0–15.0)
MCH: 25.9 pg — ABNORMAL LOW (ref 26.0–34.0)
MCHC: 31.9 g/dL (ref 30.0–36.0)
MCV: 81.1 fL (ref 80.0–100.0)
Platelets: 365 K/uL (ref 150–400)
RBC: 4.29 MIL/uL (ref 3.87–5.11)
RDW: 14.2 % (ref 11.5–15.5)
WBC: 7 K/uL (ref 4.0–10.5)
nRBC: 0 % (ref 0.0–0.2)

## 2024-02-17 LAB — HEMOGLOBIN AND HEMATOCRIT, BLOOD
HCT: 36.1 % (ref 36.0–46.0)
Hemoglobin: 11.7 g/dL — ABNORMAL LOW (ref 12.0–15.0)

## 2024-02-17 MED ORDER — DOCUSATE SODIUM 100 MG PO CAPS
100.0000 mg | ORAL_CAPSULE | Freq: Two times a day (BID) | ORAL | Status: DC | PRN
  Administered 2024-02-17 – 2024-02-19 (×2): 100 mg via ORAL
  Filled 2024-02-17 (×2): qty 1

## 2024-02-17 MED ORDER — HYDROCHLOROTHIAZIDE 25 MG PO TABS
25.0000 mg | ORAL_TABLET | Freq: Every day | ORAL | Status: DC
  Administered 2024-02-18: 25 mg via ORAL
  Filled 2024-02-17: qty 1

## 2024-02-17 MED ORDER — ONDANSETRON HCL 4 MG/2ML IJ SOLN
4.0000 mg | Freq: Three times a day (TID) | INTRAMUSCULAR | Status: DC | PRN
  Administered 2024-02-17 – 2024-02-18 (×2): 4 mg via INTRAVENOUS
  Filled 2024-02-17 (×2): qty 2

## 2024-02-17 MED ORDER — NAPROXEN SODIUM 275 MG PO TABS
275.0000 mg | ORAL_TABLET | Freq: Three times a day (TID) | ORAL | Status: DC
  Filled 2024-02-17 (×2): qty 1

## 2024-02-17 MED ORDER — ONDANSETRON HCL 4 MG PO TABS
4.0000 mg | ORAL_TABLET | Freq: Three times a day (TID) | ORAL | Status: DC | PRN
  Administered 2024-02-18: 4 mg via ORAL
  Filled 2024-02-17 (×2): qty 1

## 2024-02-17 MED ORDER — ONDANSETRON HCL 4 MG/2ML IJ SOLN: 4.0000 mg | Freq: Three times a day (TID) | INTRAMUSCULAR | Status: DC | PRN

## 2024-02-17 MED ORDER — NAPROXEN 250 MG PO TABS
250.0000 mg | ORAL_TABLET | Freq: Three times a day (TID) | ORAL | Status: DC
  Administered 2024-02-17 – 2024-02-19 (×8): 250 mg via ORAL
  Filled 2024-02-17 (×8): qty 1

## 2024-02-17 MED ORDER — FERROUS SULFATE 325 (65 FE) MG PO TABS
325.0000 mg | ORAL_TABLET | ORAL | Status: DC
  Administered 2024-02-17 – 2024-02-19 (×2): 325 mg via ORAL
  Filled 2024-02-17 (×3): qty 1

## 2024-02-17 MED ORDER — OXYCODONE HCL 5 MG PO TABS: 5.0000 mg | ORAL_TABLET | ORAL | Status: DC | PRN

## 2024-02-17 NOTE — Plan of Care (Signed)
  Problem: Clinical Measurements: Goal: Respiratory complications will improve Outcome: Progressing   Problem: Activity: Goal: Risk for activity intolerance will decrease Outcome: Progressing   Problem: Nutrition: Goal: Adequate nutrition will be maintained Outcome: Progressing   

## 2024-02-17 NOTE — Telephone Encounter (Signed)
 Called over to the [facility] office at [facility] provided information as seen below from Dr.Miller. A message has been sent to Dr.Reina regarding message and requesting to see if the surgery can be moved up.

## 2024-02-17 NOTE — Progress Notes (Signed)
  Progress Note   Patient: Michele Pennington FMW:[id number] DOB: [birth year] DOA: 02/15/2024     0 DOS: the patient was seen and examined on 02/17/2024        Brief hospital course: 33 yo F with MO, fibroids, HTN presented with abdominal cramping.     Assessment and Plan: Symptomatic fibroids No improvement, still in severe pain, unable to eat, no pain relief from Naproxen , oxycodone  or tramadol  today. - Titrate up analgesics - Continue doxycycline , norethindrone   Anemia Mild, stable.  Hypertension BP elevated - Resume HCTZ         Subjective: No change to pain, no improvement with oxycodone , tramadol , Toradol  or naproxen .     Physical Exam: BP (!) 155/99 (BP Location: Left Arm)   Pulse 89   Temp 98.1 F (36.7 C) (Oral)   Resp 16   Ht 5' 3 (1.6 m)   Wt 116.6 kg   SpO2 100%   BMI 45.54 kg/m   Adult female, lying in bed, no acute distress   Data Reviewed: H/H stable     Family Communication:     Disposition: Status is: Observation         Author: Lonni SHAUNNA Dalton, MD 02/17/2024 5:36 PM  For on call review [email or web address].

## 2024-02-17 NOTE — Telephone Encounter (Signed)
-----   Message from Ronal GORMAN Pinal sent at 02/17/2024  8:15 AM EDT ----- Regarding: surgery in September Esiquio Boesen, Could you call the [facility] office at [facility] so they are aware this pt was hospitalized as [facility] for pain management due to pelvic cramping and continued bleeding that basically hasn't stopped since her surgery in June.  She is scheduled in a month but I'm wondering if her surgery can be moved up?  Thanks.  Michele Pennington

## 2024-02-18 ENCOUNTER — Other Ambulatory Visit (HOSPITAL_COMMUNITY): Payer: Self-pay

## 2024-02-18 DIAGNOSIS — R102 Pelvic and perineal pain: Secondary | ICD-10-CM | POA: Diagnosis not present

## 2024-02-18 LAB — BASIC METABOLIC PANEL WITH GFR
Anion gap: 14 (ref 5–15)
BUN: 11 mg/dL (ref 6–20)
CO2: 22 mmol/L (ref 22–32)
Calcium: 8.9 mg/dL (ref 8.9–10.3)
Chloride: 105 mmol/L (ref 98–111)
Creatinine, Ser: 0.89 mg/dL (ref 0.44–1.00)
GFR, Estimated: >60 mL/min (ref >60)
Glucose, Bld: 88 mg/dL (ref 70–99)
Potassium: 3.8 mmol/L (ref 3.5–5.1)
Sodium: 141 mmol/L (ref 135–145)

## 2024-02-18 LAB — CBC
HCT: 35.2 % — ABNORMAL LOW (ref 36.0–46.0)
Hemoglobin: 11.2 g/dL — ABNORMAL LOW (ref 12.0–15.0)
MCH: 25.7 pg — ABNORMAL LOW (ref 26.0–34.0)
MCHC: 31.8 g/dL (ref 30.0–36.0)
MCV: 80.7 fL (ref 80.0–100.0)
Platelets: 330 K/uL (ref 150–400)
RBC: 4.36 MIL/uL (ref 3.87–5.11)
RDW: 14.3 % (ref 11.5–15.5)
WBC: 8.4 K/uL (ref 4.0–10.5)
nRBC: 0 % (ref 0.0–0.2)

## 2024-02-18 MED ORDER — POLYETHYLENE GLYCOL 3350 17 G PO PACK
17.0000 g | PACK | Freq: Once | ORAL | Status: AC
  Administered 2024-02-18: 17 g via ORAL
  Filled 2024-02-18: qty 1

## 2024-02-18 MED ORDER — FERROUS SULFATE 325 (65 FE) MG PO TABS: 325.0000 mg | ORAL_TABLET | ORAL | Status: AC

## 2024-02-18 MED ORDER — DOCUSATE SODIUM 100 MG PO CAPS
100.0000 mg | ORAL_CAPSULE | Freq: Two times a day (BID) | ORAL | 0 refills | Status: AC | PRN
  Filled 2024-02-18: qty 10, 5d supply, fill #0

## 2024-02-18 MED ORDER — ACETAMINOPHEN 500 MG PO TABS: 1000.0000 mg | ORAL_TABLET | Freq: Three times a day (TID) | ORAL | Status: DC

## 2024-02-18 MED ORDER — HYDROCODONE-ACETAMINOPHEN 5-325 MG PO TABS
1.0000 | ORAL_TABLET | Freq: Four times a day (QID) | ORAL | 0 refills | Status: DC | PRN
  Filled 2024-02-18: qty 20, 5d supply, fill #0

## 2024-02-18 MED ORDER — SENNA 8.6 MG PO TABS
1.0000 | ORAL_TABLET | Freq: Once | ORAL | Status: AC
  Administered 2024-02-18: 8.6 mg via ORAL
  Filled 2024-02-18: qty 1

## 2024-02-18 MED ORDER — ONDANSETRON HCL 4 MG PO TABS
4.0000 mg | ORAL_TABLET | Freq: Three times a day (TID) | ORAL | 0 refills | Status: AC | PRN
  Filled 2024-02-18: qty 18, 21d supply, fill #0

## 2024-02-18 MED ORDER — HYDROCODONE-ACETAMINOPHEN 5-325 MG PO TABS
1.0000 | ORAL_TABLET | ORAL | Status: DC | PRN
  Administered 2024-02-18 – 2024-02-19 (×5): 1 via ORAL
  Filled 2024-02-18 (×5): qty 1

## 2024-02-18 MED ORDER — NORETHINDRONE ACETATE 5 MG PO TABS
5.0000 mg | ORAL_TABLET | Freq: Two times a day (BID) | ORAL | 0 refills | Status: DC
  Filled 2024-02-18: qty 60, 30d supply, fill #0

## 2024-02-18 MED ORDER — ACETAMINOPHEN 500 MG PO TABS: 500.0000 mg | ORAL_TABLET | Freq: Three times a day (TID) | ORAL | Status: AC | PRN

## 2024-02-18 MED ORDER — NAPROXEN SODIUM 220 MG PO TABS
220.0000 mg | ORAL_TABLET | Freq: Two times a day (BID) | ORAL | Status: AC
Start: 2024-02-18 — End: ?

## 2024-02-18 NOTE — Evaluation (Signed)
 Physical Therapy Evaluation and discharge Patient Details Name: Anely Spiewak MRN: [id number] DOB: 24-Jan-1991 Today's Date: 02/18/2024  History of Present Illness  33 y.o. female presenting 02/16/24 with abdominal pain.  PMH significant of uterine fibroids s/p hysteroscopic myomectomy  Clinical Impression  Patient evaluated by Physical Therapy with no further acute PT needs identified. Informed pt that insurance would not cover a walker for home (see TOC note). Pt reported having crutches at home due to broken foot last summer. Discussed rationale for using crutches instead of furniture. Patient return demonstrated all mobility modified independent with crutches. All education has been completed and the patient has no further questions. PT is signing off. Thank you for this referral.         If plan is discharge home, recommend the following: Assistance with cooking/housework;Assist for transportation;Help with stairs or ramp for entrance   Can travel by private vehicle        Equipment Recommendations None recommended by PT (pt owns crutches)  Recommendations for Other Services       Functional Status Assessment Patient has had a recent decline in their functional status and demonstrates the ability to make significant improvements in function in a reasonable and predictable amount of time.     Precautions / Restrictions Precautions Precautions: Fall Recall of Precautions/Restrictions: Intact      Mobility  Bed Mobility Overal bed mobility: Modified Independent             General bed mobility comments: slight dizziness upon sitting which resolved    Transfers Overall transfer level: Modified independent Equipment used: Crutches                    Ambulation/Gait Ambulation/Gait assistance: Contact guard assist, Modified independent (Device/Increase time) Gait Distance (Feet): 70 Feet Assistive device: Crutches Gait Pattern/deviations:  Step-to pattern, Decreased stride length   Gait velocity interpretation: <1.8 ft/sec, indicate of risk for recurrent falls   General Gait Details: pt familiar with use of crutches due to broke her foot last summer  Stairs Stairs:  (pt deferred; able to verbalize how she manages crutches with rail)          Wheelchair Mobility     Tilt Bed    Modified Rankin (Stroke Patients Only)       Balance Overall balance assessment: Mild deficits observed, not formally tested                                           Pertinent Vitals/Pain Pain Assessment Pain Assessment: 0-10 Pain Score: 4  Pain Location: pelvis Pain Descriptors / Indicators: Cramping Pain Intervention(s): Limited activity within patient's tolerance    Home Living Family/patient expects to be discharged to:: Private residence Living Arrangements: Parent (mom 7:30a-6p) Available Help at Discharge: Family;Available PRN/intermittently Type of Home: House Home Access: Level entry       Home Layout: Bed/bath upstairs;1/2 bath on main level Home Equipment: Crutches      Prior Function Prior Level of Function : Independent/Modified Independent               ADLs Comments: prefers not to drive     Extremity/Trunk Assessment   Upper Extremity Assessment Upper Extremity Assessment: Overall WFL for tasks assessed    Lower Extremity Assessment Lower Extremity Assessment: Overall WFL for tasks assessed    Cervical / Trunk Assessment Cervical /  Trunk Assessment: Other exceptions Cervical / Trunk Exceptions: overweight  Communication   Communication Communication: No apparent difficulties    Cognition Arousal: Alert Behavior During Therapy: WFL for tasks assessed/performed   PT - Cognitive impairments: No apparent impairments                         Following commands: Intact       Cueing Cueing Techniques: Verbal cues     General Comments      Exercises      Assessment/Plan    PT Assessment Patient does not need any further PT services  PT Problem List         PT Treatment Interventions      PT Goals (Current goals can be found in the Care Plan section)  Acute Rehab PT Goals Patient Stated Goal: possibly home tomorrow PT Goal Formulation: All assessment and education complete, DC therapy    Frequency       Co-evaluation               AM-PAC PT 6 Clicks Mobility  Outcome Measure Help needed turning from your back to your side while in a flat bed without using bedrails?: None Help needed moving from lying on your back to sitting on the side of a flat bed without using bedrails?: None Help needed moving to and from a bed to a chair (including a wheelchair)?: None Help needed standing up from a chair using your arms (e.g., wheelchair or bedside chair)?: None Help needed to walk in hospital room?: None Help needed climbing 3-5 steps with a railing? : None 6 Click Score: 24    End of Session Equipment Utilized During Treatment: Gait belt Activity Tolerance: Patient tolerated treatment well Patient left: in bed;with call bell/phone within reach Nurse Communication: Mobility status (no DME needs) PT Visit Diagnosis: Other abnormalities of gait and mobility (R26.89)    Time: 1440-1510 PT Time Calculation (min) (ACUTE ONLY): 30 min   Charges:   PT Evaluation $PT Eval Low Complexity: 1 Low PT Treatments $Gait Training: 8-22 mins PT General Charges $$ ACUTE PT VISIT: 1 Visit          Macario RAMAN, PT Acute Rehabilitation Services  Office [phone]   Macario SHAUNNA Soja 02/18/2024, 3:17 PM

## 2024-02-18 NOTE — Progress Notes (Signed)
  Progress Note   Patient: Michele Pennington FMW:[id number] DOB: 18-Feb-1991 DOA: 02/15/2024     0 DOS: the patient was seen and examined on 02/18/2024        Brief hospital course: 33 yo F with MO, fibroids, HTN presented with abdominal cramping.     Assessment and Plan: Symptomatic fibroids Very weak and limited today in her ability to move around.   - Transition to Vicodin - PT eval - Stop doxy - Continue norethindrone    Anemia Mild, stable. - Resume oral iron  Hypertension No longer on HCTZ         Subjective: Still very weak, very tired     Physical Exam: BP 139/86 (BP Location: Right Arm)   Pulse 71   Temp 98.4 F (36.9 C) (Oral)   Resp 18   Ht 5' 3 (1.6 m)   Wt 116.6 kg   SpO2 100%   BMI 45.54 kg/m   General: Pt is alert, awake, not in acute distress Cardiovascular: RRR, nl S1-S2, no murmurs appreciated.   No LE edema.   Respiratory: Normal respiratory rate and rhythm.  CTAB without rales or wheezes. Abdominal: Abdomen soft and mildly tender throughout.  No distension or HSM.   Neuro/Psych: Strength symmetric in upper and lower extremities.  Judgment and insight appear normal.    Data Reviewed: H/H stable     Family Communication:     Disposition: Status is: Observation         Author: Lonni SHAUNNA Dalton, MD 02/18/2024 4:56 PM  For on call review [email or web address].

## 2024-02-18 NOTE — Progress Notes (Addendum)
   02/18/24 1325  TOC Brief Assessment  Insurance and Status Reviewed  Patient has primary care physician Yes  Prior level of function: independent  Prior/Current Home Services No current home services  Social Drivers of Health Review SDOH reviewed no interventions necessary  Readmission risk has been reviewed Yes  Transition of care needs  (ordered walker with [employer])   Per MItch with [employer] , patient does not have a DX for insurance to cover a walker. NCM asked [employer] to discuss with patient and offer private pay   Per Mitch with [employer], patient does not have a medical reason for insurance to cover a walker. Patient worked with PT who are recommending crutches. Patient has crutches at home already

## 2024-02-19 ENCOUNTER — Other Ambulatory Visit (HOSPITAL_COMMUNITY): Payer: Self-pay

## 2024-02-19 DIAGNOSIS — R102 Pelvic and perineal pain: Secondary | ICD-10-CM | POA: Diagnosis not present

## 2024-02-19 NOTE — Plan of Care (Signed)

## 2024-02-19 NOTE — Plan of Care (Signed)

## 2024-02-19 NOTE — Progress Notes (Signed)
 Mobility Specialist Progress Note:    02/19/24 0937  Mobility  Activity Ambulated with assistance (In hallway)  Level of Assistance Modified independent, requires aide device or extra time  Assistive Device Crutches  Distance Ambulated (ft) 84 ft  Activity Response Tolerated well  Mobility Referral Yes  Mobility visit 1 Mobility  Mobility Specialist Start Time (ACUTE ONLY) 0859  Mobility Specialist Stop Time (ACUTE ONLY) 0910  Mobility Specialist Time Calculation (min) (ACUTE ONLY) 11 min   Received pt in bed and agreeable to mobility. No physical assistance needed. C/o BLE pain, otherwise tolerated well/. Returned to room without fault. Left pt in bed with personal belongings and call light within reach. All needs met.  Lavanda Pollack Mobility Specialist  Please contact via [employer] or  Rehab Office [phone]

## 2024-02-19 NOTE — Progress Notes (Signed)
 Med list completed.  RN notified to review and update if needed.   Patient in bed. Resting.

## 2024-02-19 NOTE — Discharge Summary (Signed)
 Physician Discharge Summary   Patient: Michele Pennington MRN: [id number] DOB: February 12, 1991  Admit date:     02/15/2024  Discharge date: 02/19/24  Discharge Physician: Lonni SHAUNNA Dalton   PCP: Knute Thersia Bitters, FNP     Recommendations at discharge:  Follow up with Drs. Cleotilde and [place] for symptomatic fibroids     Discharge Diagnoses: Principal Problem:   Pelvic pain due to fibroids Other hospital problems   Hypertension   Iron deficiency anemia due to chronic blood loss   Class 3 obesity   Right ovarian cyst      Hospital Course: 33 y.o. F with fibroids, HTN who presented with pelvic pain.  Patient developed symptomatic fibroids earlier this year, was referred to [facility] for hysteroscopic myomectomy but unfortunately exceeded optimal fluid deficit intra-op and so fibroids were not completely resected.  Since then she has had persistent ongoing pain.  She tried to return to work this week, but evidently had issues with bleeding and cramping during work and came to the ER.    Fibroids Blood loss anemia Met with Gyn here, Dr. Cleotilde, who is familiar with patient.  Observed in the hospital, had frequent discharge of clots, but hemoglobin remained stable during hospital stay.  Pain limited activity substantially, and despite titration of NSAIDs, acetaminophen  and oral opiates around the clock patient required walker or crutches to ambulate.  A successful oral analgesic regimen was found, but currently she is not able to work due to her illness. - Follow up with Dr. Katheren          [place]  Controlled Substances Registry was reviewed for this patient prior to discharge.  Consultants: OB-Gyn   Disposition: Home Diet recommendation:  Discharge Diet Orders (From admission, onward)     Start     Ordered   02/18/24 0000  Diet - low sodium heart healthy        02/18/24 1701             DISCHARGE MEDICATION: Allergies as of  02/19/2024       Reactions   Other Nausea Only   MRI contrast - nausea        Medication List     PAUSE taking these medications    hydrochlorothiazide  25 MG tablet Wait to take this until your doctor or other care provider tells you to start again. Commonly known as: HYDRODIURIL  Take 1 tablet (25 mg total) by mouth daily.       STOP taking these medications    Advil  Dual Action 125-250 MG Tabs Generic drug: Ibuprofen -Acetaminophen    doxycycline  100 MG tablet Commonly known as: VIBRA -TABS   ibuprofen  600 MG tablet Commonly known as: ADVIL    ibuprofen  800 MG tablet Commonly known as: ADVIL        TAKE these medications    acetaminophen  500 MG tablet Commonly known as: TYLENOL  Take 1 tablet (500 mg total) by mouth every 8 (eight) hours as needed.   cetirizine  10 MG tablet Commonly known as: ZYRTEC  Take 1 tablet (10 mg total) by mouth daily.   docusate sodium  100 MG capsule Commonly known as: COLACE Take 1 capsule (100 mg total) by mouth 2 (two) times daily as needed for mild constipation.   ferrous sulfate  325 (65 FE) MG tablet Take 1 tablet (325 mg total) by mouth every other day. What changed: when to take this   fluticasone  50 MCG/ACT nasal spray Commonly known as: FLONASE  Place 2 sprays into both nostrils daily. What changed:  when to take this reasons to take this   HYDROcodone -acetaminophen  5-325 MG tablet Commonly known as: NORCO/VICODIN Take 1 tablet by mouth every 6 (six) hours as needed for moderate pain (pain score 4-6) or severe pain (pain score 7-10).   naproxen  sodium 220 MG tablet Commonly known as: Aleve  Take 1 tablet (220 mg total) by mouth 2 (two) times daily with a meal.   norethindrone  5 MG tablet Commonly known as: AYGESTIN  Take 1 tablet (5 mg total) by mouth 2 (two) times daily.   ondansetron  4 MG tablet Commonly known as: ZOFRAN  Take 1 tablet (4 mg total) by mouth every 8 (eight) hours as needed for nausea or vomiting.    Vitamin D3 1.25 MG (50000 UT) Caps Take 1 capsule (50,000 Units total) by mouth once a week.               Durable Medical Equipment  (From admission, onward)           Start     Ordered   02/18/24 1307  DME Walker  Once       Question Answer Comment  Walker: With 5 Inch Wheels   Patient needs a walker to treat with the following condition Pelvic pain      02/18/24 1307            Follow-up Information     Cleotilde Ronal RAMAN, MD. Call in 1 day.   Specialty: Obstetrics and Gynecology Contact information: [address] [place] [address] [phone]         Katheren Elyn Ip, MD Follow up.   Specialties: Gynecology, Obstetrics and Gynecology Contact information: [address] [place] [address] [phone]                 Discharge Instructions     Diet - low sodium heart healthy   Complete by: As directed    Discharge instructions   Complete by: As directed    For pain: Take a combination of acetaminophen , hydrocodone  and naproxen   Take naproxen  220 mg twice daily You MAY increase this to 440 mg twice daily (two tabs at a time) or 220 mg (1 tab) three times daily, but do that for no more than a week  If you do take higher doses, I recommend taking with omeprazole 20 mg once daily  Take hydrocodone -acetaminophen  5-325 mg up to four times daily Minimize dose Do not drive or take with alcohol  If you take hydrocodone  with acetaminophen , you may add additional tylenol , but limit to 500 mg (1 tab) three times daily   Increase activity slowly   Complete by: As directed    Increase activity slowly   Complete by: As directed        Discharge Exam: Filed Weights   02/16/24 0809  Weight: 116.6 kg    General: Pt is alert, awake, not in acute distress Respiratory: Normal respiratory rate and rhythm.   Abdominal: No distension or HSM.   Neuro/Psych: Strength symmetric in upper and lower  extremities.  Judgment and insight appear normal.   Condition at discharge: fair  The results of significant diagnostics from this hospitalization (including imaging, microbiology, ancillary and laboratory) are listed below for reference.   Imaging Studies: CT ABDOMEN PELVIS W CONTRAST Result Date: 02/15/2024 CLINICAL DATA:  Abdominal pain EXAM: CT ABDOMEN AND PELVIS WITH CONTRAST TECHNIQUE: Multidetector CT imaging of the abdomen and pelvis was performed using the standard protocol following bolus administration  of intravenous contrast. RADIATION DOSE REDUCTION: This exam was performed according to the departmental dose-optimization program which includes automated exposure control, adjustment of the mA and/or kV according to patient size and/or use of iterative reconstruction technique. CONTRAST:  100mL OMNIPAQUE  IOHEXOL  300 MG/ML  SOLN COMPARISON:  None Available. FINDINGS: Lower chest: No acute findings Hepatobiliary: No focal hepatic abnormality. Gallbladder unremarkable. Pancreas: No focal abnormality or ductal dilatation. Spleen: No focal abnormality.  Normal size. Adrenals/Urinary Tract: No adrenal abnormality. No focal renal abnormality. No stones or hydronephrosis. Urinary bladder is unremarkable. Stomach/Bowel: Normal appendix. Stomach, large and small bowel grossly unremarkable. Vascular/Lymphatic: No evidence of aneurysm or adenopathy. Reproductive: Multiple fibroids in the uterus measuring up to 3.6 cm in the fundus. Markedly abnormal appearing endometrium which measures 5 cm in thickness. Right ovarian cystic lesion measures 3.3 cm. Left ovary unremarkable. Other: No free fluid or free air. Musculoskeletal: No acute bony abnormality. IMPRESSION: Fibroid uterus. Markedly abnormal, thickened and heterogeneous endometrium measuring up to 5 cm in thickness. Recommend further evaluation with pelvic ultrasound. 3.2 cm right ovarian cystic lesion. Recommend attention on pelvic ultrasound.  Electronically Signed   By: Franky Crease M.D.   On: 02/15/2024 20:31   US  PELVIC COMPLETE W TRANSVAGINAL AND TORSION R/O Result Date: 02/15/2024 CLINICAL DATA:  Pelvic pain and vaginal bleeding for 3 days. LMP 2 weeks ago. EXAM: TRANSABDOMINAL AND TRANSVAGINAL ULTRASOUND OF PELVIS DOPPLER ULTRASOUND OF OVARIES TECHNIQUE: Both transabdominal and transvaginal ultrasound examinations of the pelvis were performed. Transabdominal technique was performed for global imaging of the pelvis including uterus, ovaries, adnexal regions, and pelvic cul-de-sac. Color and duplex Doppler ultrasound was utilized to evaluate blood flow to the ovaries. COMPARISON:  Ultrasound 08/11/2018 FINDINGS: Uterus Measurements: 16.0 x 9.0 x 7.1 cm = volume: 537 mL. Multiple fibroids are redemonstrated. The largest is in the right uterine fundus and measures 5.4 x 4.5 x 4.3 cm. It is unclear if this is a intramural fibroid indenting the endometrium or a submucosal fibroid. Endometrium Thickened endometrium measuring 23 mm. Right ovary Measurements: 4.3 x 4.3 x 4.7 cm = volume: 45 mL. Heterogenous avascular complex cystic mass measuring 3.4 x 3.6 x 3.7 cm. Left ovary Measurements: 3.9 x 2.6 x 2.3 cm = volume: 12 mL. Normal appearance/no adnexal mass. Pulsed Doppler evaluation of both ovaries demonstrates normal low-resistance arterial and venous waveforms. Other findings Trace free fluid. IMPRESSION: 1. Multiple uterine fibroids. The largest is in the right uterine fundus and measures 5.4 x 4.5 x 4.3 cm. It is unclear if this is a intramural fibroid indenting the endometrium or a submucosal fibroid. 2. Thickened endometrium measuring 23 mm. If bleeding remains unresponsive to hormonal or medical therapy, focal lesion work-up with sonohysterogram should be considered. Endometrial biopsy should also be considered in pre-menopausal patients at high risk for endometrial carcinoma. (Ref: Radiological Reasoning: Algorithmic Workup of Abnormal Vaginal  Bleeding with Endovaginal Sonography and Sonohysterography. AJR 2008; 808:D31-26) 3. Heterogenous avascular complex cystic mass in the right ovary measuring 3.7 cm. This is indeterminate may represent a hemorrhagic cyst. Recommend follow-up ultrasound in 6-12 weeks. 4. No evidence of ovarian torsion. Electronically Signed   By: Norman Gatlin M.D.   On: 02/15/2024 19:19    Microbiology: Results for orders placed or performed during the hospital encounter of 01/05/23  SARS Coronavirus 2 by RT PCR (hospital order, performed in [facility] hospital lab) *cepheid single result test* Anterior Nasal Swab     Status: None   Collection Time: 01/05/23  2:20 PM  Specimen: Anterior Nasal Swab  Result Value Ref Range Status   SARS Coronavirus 2 by RT PCR NEGATIVE NEGATIVE Final    Comment: (NOTE) SARS-CoV-2 target nucleic acids are NOT DETECTED.  The SARS-CoV-2 RNA is generally detectable in upper and lower respiratory specimens during the acute phase of infection. The lowest concentration of SARS-CoV-2 viral copies this assay can detect is 250 copies / mL. A negative result does not preclude SARS-CoV-2 infection and should not be used as the sole basis for treatment or other patient management decisions.  A negative result may occur with improper specimen collection / handling, submission of specimen other than nasopharyngeal swab, presence of viral mutation(s) within the areas targeted by this assay, and inadequate number of viral copies (<250 copies / mL). A negative result must be combined with clinical observations, patient history, and epidemiological information.  Fact Sheet for Patients:   RoadLapTop.co.za  Fact Sheet for Healthcare Providers: http://kim-miller.com/  This test is not yet approved or  cleared by the [place]  FDA and has been authorized for detection and/or diagnosis of SARS-CoV-2 by FDA under an Emergency Use  Authorization (EUA).  This EUA will remain in effect (meaning this test can be used) for the duration of the COVID-19 declaration under Section 564(b)(1) of the Act, 21 U.S.C. section 360bbb-3(b)(1), unless the authorization is terminated or revoked sooner.  Performed at [facility], [address] [place], [place] [address]     Labs: CBC: Recent Labs  Lab 02/15/24 1708 02/16/24 0705 02/17/24 0233 02/17/24 1426 02/18/24 0531  WBC 9.0 8.4 7.0  --  8.4  NEUTROABS 6.6  --   --   --   --   HGB 11.4* 11.2* 11.1* 11.7* 11.2*  HCT 35.2* 34.5* 34.8* 36.1 35.2*  MCV 80.2 79.7* 81.1  --  80.7  PLT 383 341 365  --  330   Basic Metabolic Panel: Recent Labs  Lab 02/15/24 1708 02/16/24 0705 02/18/24 0531  NA 139 136 141  K 3.6 3.5 3.8  CL 104 108 105  CO2 23 23 22   GLUCOSE 112* 95 88  BUN 12 8 11   CREATININE 0.97 0.88 0.89  CALCIUM 9.8 8.8* 8.9   Liver Function Tests: Recent Labs  Lab 02/15/24 1708 02/16/24 0705  AST 16 14*  ALT 7 11  ALKPHOS 49 38  BILITOT 0.2 0.4  PROT 7.5 6.7  ALBUMIN 4.2 3.3*   CBG: No results for input(s): GLUCAP in the last 168 hours.  Discharge time spent: approximately 25 minutes spent on discharge counseling, evaluation of patient on day of discharge, and coordination of discharge planning with nursing, social work, pharmacy and case management  Signed: Lonni SHAUNNA Dalton, MD [facility] 02/19/2024

## 2024-02-23 HISTORY — PX: MYOMECTOMY: SHX85

## 2024-02-25 ENCOUNTER — Emergency Department (HOSPITAL_BASED_OUTPATIENT_CLINIC_OR_DEPARTMENT_OTHER)
Admission: EM | Admit: 2024-02-25 | Discharge: 2024-02-25 | Disposition: A | Discharge status: Home / Self Care | Attending: Emergency Medicine | Admitting: Emergency Medicine

## 2024-02-25 ENCOUNTER — Encounter (HOSPITAL_BASED_OUTPATIENT_CLINIC_OR_DEPARTMENT_OTHER): Payer: Self-pay | Admitting: Family Medicine

## 2024-02-25 ENCOUNTER — Emergency Department (HOSPITAL_BASED_OUTPATIENT_CLINIC_OR_DEPARTMENT_OTHER)

## 2024-02-25 ENCOUNTER — Other Ambulatory Visit: Payer: Self-pay

## 2024-02-25 ENCOUNTER — Encounter (HOSPITAL_BASED_OUTPATIENT_CLINIC_OR_DEPARTMENT_OTHER): Payer: Self-pay | Admitting: Emergency Medicine

## 2024-02-25 DIAGNOSIS — M79605 Pain in left leg: Secondary | ICD-10-CM | POA: Diagnosis present

## 2024-02-25 NOTE — ED Provider Notes (Signed)
 [place] EMERGENCY DEPARTMENT AT [facility] Provider Note   CSN: [id number] Arrival date & time: 02/25/24  1857     Patient presents with: Leg Pain   Michele Pennington is a 33 y.o. female.   Patient is a 33 year old female who presents to Emergency Department with a chief complaint of pain along the posterior medial aspect of the left knee radiate down to the left calf.  She does note that she was recently hospitalized and has undergone recent surgery.  She does admit to some associated edema as well.  She notes that the pain became worse today.  She denies any recent falls or blunt trauma to the affected area.  She denies any numbness or paresthesias distally.   Leg Pain      Prior to Admission medications   Medication Sig Start Date End Date Taking? Authorizing Provider  acetaminophen  (TYLENOL ) 500 MG tablet Take 1 tablet (500 mg total) by mouth every 8 (eight) hours as needed. 02/18/24   Danford, Lonni SQUIBB, MD  cetirizine  (ZYRTEC ) 10 MG tablet Take 1 tablet (10 mg total) by mouth daily. 01/06/24   Caudle, Thersia Bitters, FNP  Cholecalciferol  1.25 MG (50000 UT) capsule Take 1 capsule (50,000 Units total) by mouth once a week. Patient not taking: Reported on 02/16/2024 09/01/23   Knute Thersia Bitters, FNP  docusate sodium  (COLACE) 100 MG capsule Take 1 capsule (100 mg total) by mouth 2 (two) times daily as needed for mild constipation. 02/18/24   Danford, Lonni SQUIBB, MD  ferrous sulfate  325 (65 FE) MG tablet Take 1 tablet (325 mg total) by mouth every other day. 02/19/24   Danford, Lonni SQUIBB, MD  fluticasone  (FLONASE ) 50 MCG/ACT nasal spray Place 2 sprays into both nostrils daily. Patient taking differently: Place 2 sprays into both nostrils daily as needed for allergies. 01/06/24   Caudle, Thersia Bitters, FNP  hydrochlorothiazide  (HYDRODIURIL ) 25 MG tablet Take 1 tablet (25 mg total) by mouth daily. Patient not taking: Reported on 02/16/2024 01/06/24   Knute Thersia Bitters, FNP  HYDROcodone -acetaminophen  (NORCO/VICODIN) 5-325 MG tablet Take 1 tablet by mouth every 6 (six) hours as needed for moderate pain (pain score 4-6) or severe pain (pain score 7-10). 02/18/24   Danford, Lonni SQUIBB, MD  naproxen  sodium (ALEVE ) 220 MG tablet Take 1 tablet (220 mg total) by mouth 2 (two) times daily with a meal. 02/18/24   Danford, Lonni SQUIBB, MD  norethindrone  (AYGESTIN ) 5 MG tablet Take 1 tablet (5 mg total) by mouth 2 (two) times daily. 02/18/24   Danford, Lonni SQUIBB, MD  ondansetron  (ZOFRAN ) 4 MG tablet Take 1 tablet (4 mg total) by mouth every 8 (eight) hours as needed for nausea or vomiting. 02/18/24   Danford, Lonni SQUIBB, MD    Allergies: Other    Review of Systems  Musculoskeletal:        Pain along left lower extremity  All other systems reviewed and are negative.   Updated Vital Signs BP (!) 154/98 (BP Location: Right Arm)   Pulse 71   Temp 98.8 F (37.1 C) (Oral)   Resp 17   LMP 02/02/2024   SpO2 99%   Physical Exam Vitals and nursing note reviewed.  Constitutional:      General: She is not in acute distress.    Appearance: Normal appearance. She is not ill-appearing.  HENT:     Head: Normocephalic and atraumatic.  Eyes:     Extraocular Movements: Extraocular movements intact.     Conjunctiva/sclera:  Conjunctivae normal.     Pupils: Pupils are equal, round, and reactive to light.  Cardiovascular:     Rate and Rhythm: Normal rate and regular rhythm.     Pulses: Normal pulses.     Heart sounds: Normal heart sounds.  Pulmonary:     Effort: Pulmonary effort is normal. No respiratory distress.  Musculoskeletal:        General: Normal range of motion.     Comments: Tenderness palpation noted along the medial and posterior aspect of the left knee extending down to the left calf, nontender palpation directly over the left hip, ankle, foot, DP and PT pulses are 2+ distally, sensation intact distally, full range of motion noted  throughout, no overlying erythema or warmth, no obvious deformity or bruising, no skin breakdown or ulceration, no lacerations or abrasions  Skin:    General: Skin is warm and dry.  Neurological:     General: No focal deficit present.     Mental Status: She is alert and oriented to person, place, and time. Mental status is at baseline.  Psychiatric:        Mood and Affect: Mood normal.        Behavior: Behavior normal.        Thought Content: Thought content normal.        Judgment: Judgment normal.     (all labs ordered are listed, but only abnormal results are displayed) Labs Reviewed - No data to display  EKG: None  Radiology: No results found.   Procedures   Medications Ordered in the ED - No data to display                                  Medical Decision Making Patient is doing well at this time and is stable for discharge home.  Discussed with patient that ultrasound demonstrated no indication for DVT.  She has strong peripheral pulses with no indication of acute arterial occlusion.  She has had no recent falls or blunt trauma.  She has no indication for septic joint or gout at this point.  Do not suspect any further imaging or workup is warranted in the emergency department.  Symptoms are most likely secondary to deconditioning.  Close follow-up with PCP was discussed as well as strict turn precautions for any new or worsening symptoms.  Patient voiced understanding of plan and had no additional questions.        Final diagnoses:  None    ED Discharge Orders     None          Daralene Lonni JONETTA DEVONNA 02/25/24 2116    Armenta Canning, MD 02/26/24 [phone]

## 2024-02-25 NOTE — Discharge Instructions (Signed)
 Please follow-up closely with your primary care doctor on an outpatient basis.  Return to emergency department immediately for any new or worsening symptoms.

## 2024-02-25 NOTE — ED Triage Notes (Signed)
 Left leg pain  started today ( around lunch) Behind knee and down calf Denies sob, chest pain Recent hospitalization.  Vicodin PTA around 4 pm

## 2024-02-25 NOTE — ED Notes (Signed)
 Reviewed AVS/discharge instruction with patient. Time allotted for and all questions answered. Patient is agreeable for d/c and escorted to ed exit by staff.

## 2024-02-26 ENCOUNTER — Other Ambulatory Visit (HOSPITAL_BASED_OUTPATIENT_CLINIC_OR_DEPARTMENT_OTHER): Payer: Self-pay

## 2024-02-26 ENCOUNTER — Other Ambulatory Visit (HOSPITAL_BASED_OUTPATIENT_CLINIC_OR_DEPARTMENT_OTHER): Payer: Self-pay | Admitting: Family Medicine

## 2024-02-26 ENCOUNTER — Other Ambulatory Visit (HOSPITAL_COMMUNITY): Payer: Self-pay

## 2024-02-26 DIAGNOSIS — R5381 Other malaise: Secondary | ICD-10-CM

## 2024-02-26 MED ORDER — HYDROCODONE-ACETAMINOPHEN 5-325 MG PO TABS
1.0000 | ORAL_TABLET | Freq: Three times a day (TID) | ORAL | 0 refills | Status: DC | PRN
  Filled 2024-02-26: qty 10, 4d supply, fill #0

## 2024-02-26 MED ORDER — NORETHINDRONE ACETATE 5 MG PO TABS
10.0000 mg | ORAL_TABLET | Freq: Every day | ORAL | 3 refills | Status: DC
  Filled 2024-02-26 – 2024-03-02 (×4): qty 30, 15d supply, fill #0

## 2024-02-26 NOTE — Telephone Encounter (Signed)
 Please see mychart message sent by pt and advise.

## 2024-02-26 NOTE — Telephone Encounter (Signed)
 Mychart messages were sent by pt which has been routed to PCP for review.

## 2024-02-26 NOTE — Telephone Encounter (Signed)
 Copied from CRM [id number]. Topic: Clinical - Medical Advice >> Feb 25, 2024  4:40 PM Charolett L wrote: Reason for CRM: 8/18-8/22 [facility] 09/22 Surgery on 8/26 Upcoming surgery 09/15  Patient needs order for PT and it has to go through primary care so insurance can cover it Patient having Pain in legs, hard to walk, home alone and needs a call back

## 2024-02-27 ENCOUNTER — Other Ambulatory Visit (HOSPITAL_BASED_OUTPATIENT_CLINIC_OR_DEPARTMENT_OTHER): Payer: Self-pay

## 2024-02-28 ENCOUNTER — Other Ambulatory Visit (HOSPITAL_BASED_OUTPATIENT_CLINIC_OR_DEPARTMENT_OTHER): Payer: Self-pay

## 2024-02-28 MED ORDER — IBUPROFEN 800 MG PO TABS
800.0000 mg | ORAL_TABLET | Freq: Three times a day (TID) | ORAL | 0 refills | Status: DC | PRN
  Filled 2024-02-28: qty 30, 10d supply, fill #0

## 2024-02-29 ENCOUNTER — Other Ambulatory Visit (HOSPITAL_BASED_OUTPATIENT_CLINIC_OR_DEPARTMENT_OTHER): Payer: Self-pay

## 2024-03-01 ENCOUNTER — Other Ambulatory Visit (HOSPITAL_BASED_OUTPATIENT_CLINIC_OR_DEPARTMENT_OTHER): Payer: Self-pay

## 2024-03-02 ENCOUNTER — Other Ambulatory Visit (HOSPITAL_BASED_OUTPATIENT_CLINIC_OR_DEPARTMENT_OTHER): Payer: Self-pay

## 2024-03-02 MED ORDER — NORETHINDRONE ACETATE 5 MG PO TABS
10.0000 mg | ORAL_TABLET | Freq: Every day | ORAL | 1 refills | Status: DC
  Filled 2024-03-02: qty 30, 15d supply, fill #0

## 2024-03-03 ENCOUNTER — Other Ambulatory Visit (HOSPITAL_BASED_OUTPATIENT_CLINIC_OR_DEPARTMENT_OTHER): Payer: Self-pay

## 2024-03-03 ENCOUNTER — Encounter (HOSPITAL_BASED_OUTPATIENT_CLINIC_OR_DEPARTMENT_OTHER): Payer: Self-pay | Admitting: Family Medicine

## 2024-03-03 ENCOUNTER — Ambulatory Visit (HOSPITAL_BASED_OUTPATIENT_CLINIC_OR_DEPARTMENT_OTHER): Admitting: Family Medicine

## 2024-03-03 VITALS — BP 131/85 | HR 90 | Ht 63.0 in | Wt 254.0 lb

## 2024-03-03 DIAGNOSIS — Z86018 Personal history of other benign neoplasm: Secondary | ICD-10-CM | POA: Diagnosis not present

## 2024-03-03 DIAGNOSIS — R5381 Other malaise: Secondary | ICD-10-CM

## 2024-03-03 DIAGNOSIS — R7303 Prediabetes: Secondary | ICD-10-CM

## 2024-03-03 MED ORDER — HYDROCODONE-ACETAMINOPHEN 5-325 MG PO TABS
1.0000 | ORAL_TABLET | Freq: Three times a day (TID) | ORAL | 0 refills | Status: DC | PRN
  Filled 2024-03-03: qty 10, 4d supply, fill #0

## 2024-03-03 NOTE — Patient Instructions (Signed)
 B Complex Vitamin (Vit B12) for energy support
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
## 2024-03-04 ENCOUNTER — Telehealth: Payer: Self-pay

## 2024-03-04 NOTE — Progress Notes (Signed)
 Complex Care Management Note Care Guide Note  03/04/2024 Name: Michele Pennington MRN: [id number] DOB: 10-28-1990   Complex Care Management Outreach Attempts: An unsuccessful telephone outreach was attempted today to offer the patient information about available complex care management services.  Follow Up Plan:  Additional outreach attempts will be made to offer the patient complex care management information and services.   Encounter Outcome:  Patient Request to Call Back  [facility]le [facility]  [facility], [facility] VBCI Assistant Direct Dial: [phone]  Fax: [phone]

## 2024-03-07 ENCOUNTER — Telehealth (HOSPITAL_BASED_OUTPATIENT_CLINIC_OR_DEPARTMENT_OTHER): Payer: Self-pay | Admitting: *Deleted

## 2024-03-07 ENCOUNTER — Other Ambulatory Visit (HOSPITAL_BASED_OUTPATIENT_CLINIC_OR_DEPARTMENT_OTHER): Payer: Self-pay

## 2024-03-07 MED ORDER — OXYCODONE HCL 5 MG PO TABS
5.0000 mg | ORAL_TABLET | Freq: Four times a day (QID) | ORAL | 0 refills | Status: AC | PRN
  Filled 2024-03-07: qty 12, 3d supply, fill #0

## 2024-03-07 NOTE — Telephone Encounter (Signed)
 Routing to [employer] as an Financial planner.

## 2024-03-07 NOTE — Telephone Encounter (Signed)
 Copied from CRM [id number]. Topic: Clinical - Home Health Verbal Orders >> Mar 07, 2024  9:13 AM [name] wrote: Caller/Agency: Jon with [facility],The Callback Number: [phone] Service Requested: Physical Therapy Any new concerns about the patient? Yes as the patient was supposed to start her Physical Therapy this past weekend but requested to delay it to start today Monday 03/07/24. Jon just wanted to pass this information on to the provider.

## 2024-03-10 NOTE — Progress Notes (Signed)
 Complex Care Management Note  Care Guide Note 03/10/2024 Name: Michele Pennington MRN: [id number] DOB: 04-02-1991  [name] is a 33 y.o. year old female who sees Caudle, Thersia Bitters, FNP for primary care. I reached out to [facility] by phone today to offer complex care management services.  [name] was given information about Complex Care Management services today including:   The Complex Care Management services include support from the care team which includes your Nurse Care Manager, Clinical Social Worker, or Pharmacist.  The Complex Care Management team is here to help remove barriers to the health concerns and goals most important to you. Complex Care Management services are voluntary, and the patient may decline or stop services at any time by request to their care team member.   Complex Care Management Consent Status: Patient did not agree to participate in complex care management services at this time.  Follow up plan:  Patient unsure when she will be discharged. Will outreach PCP for additional referral for RNCM.   Dreama Lynwood [facility]  [facility], [facility] VBCI Assistant Direct Dial: [phone]  Fax: [phone]

## 2024-03-14 ENCOUNTER — Other Ambulatory Visit (HOSPITAL_BASED_OUTPATIENT_CLINIC_OR_DEPARTMENT_OTHER): Payer: Self-pay

## 2024-03-14 ENCOUNTER — Telehealth (HOSPITAL_BASED_OUTPATIENT_CLINIC_OR_DEPARTMENT_OTHER): Payer: Self-pay | Admitting: Family Medicine

## 2024-03-14 MED ORDER — MEDROXYPROGESTERONE ACETATE 10 MG PO TABS
10.0000 mg | ORAL_TABLET | ORAL | 0 refills | Status: AC
  Filled 2024-03-14: qty 8, 30d supply, fill #0

## 2024-03-14 MED ORDER — ESTRADIOL 1 MG PO TABS
1.0000 mg | ORAL_TABLET | Freq: Two times a day (BID) | ORAL | 0 refills | Status: AC
  Filled 2024-03-14: qty 56, 28d supply, fill #0

## 2024-03-14 NOTE — Telephone Encounter (Signed)
 Please see new mychart sent by pt and advise.

## 2024-03-14 NOTE — Telephone Encounter (Signed)
 Copied from CRM [id number]. Topic: Clinical - Home Health Verbal Orders >> Mar 14, 2024  3:46 PM Ivette P wrote: Caller/Agency: Leita - [facility]  Callback Number: [phone] - secured line  Service Requested: Occupational Therapy, Physical Therapy, and Skilled Nursing Frequency: 1 time a week for 4 weeks  Pt and OT: 1 time a week for 1 week.  Any new concerns about the patient? Yes, pt had a lot of blood loss during recent surgery, hypertension - waiting on outcome.

## 2024-03-15 NOTE — Telephone Encounter (Signed)
 Alexis, please advise if you are okay with us  providing the verbal okay for pt to have OT and PT.

## 2024-03-15 NOTE — Telephone Encounter (Signed)
 Called and spoke with Leita giving her the verbal okay for PT and OT. Leita verbalized understanding. Nothing further needed.

## 2024-03-25 NOTE — Telephone Encounter (Signed)
 Patient had surgery performed with MIGS 03/09/24.

## 2024-04-04 DIAGNOSIS — E559 Vitamin D deficiency, unspecified: Secondary | ICD-10-CM

## 2024-04-04 DIAGNOSIS — F411 Generalized anxiety disorder: Secondary | ICD-10-CM | POA: Diagnosis not present

## 2024-04-04 DIAGNOSIS — R7303 Prediabetes: Secondary | ICD-10-CM

## 2024-04-04 DIAGNOSIS — D509 Iron deficiency anemia, unspecified: Secondary | ICD-10-CM

## 2024-04-04 DIAGNOSIS — I10 Essential (primary) hypertension: Secondary | ICD-10-CM

## 2024-04-04 DIAGNOSIS — F331 Major depressive disorder, recurrent, moderate: Secondary | ICD-10-CM | POA: Diagnosis not present

## 2024-04-04 DIAGNOSIS — N921 Excessive and frequent menstruation with irregular cycle: Secondary | ICD-10-CM

## 2024-04-04 DIAGNOSIS — D259 Leiomyoma of uterus, unspecified: Secondary | ICD-10-CM | POA: Diagnosis not present

## 2024-04-04 DIAGNOSIS — Z483 Aftercare following surgery for neoplasm: Secondary | ICD-10-CM | POA: Diagnosis not present

## 2024-04-04 DIAGNOSIS — G43E19 Chronic migraine with aura, intractable, without status migrainosus: Secondary | ICD-10-CM

## 2024-04-12 ENCOUNTER — Encounter (HOSPITAL_BASED_OUTPATIENT_CLINIC_OR_DEPARTMENT_OTHER): Payer: Self-pay

## 2024-04-12 ENCOUNTER — Ambulatory Visit (HOSPITAL_BASED_OUTPATIENT_CLINIC_OR_DEPARTMENT_OTHER): Admitting: Physical Therapy

## 2024-04-19 ENCOUNTER — Encounter (HOSPITAL_BASED_OUTPATIENT_CLINIC_OR_DEPARTMENT_OTHER): Admitting: Physical Therapy

## 2024-04-26 ENCOUNTER — Encounter (HOSPITAL_BASED_OUTPATIENT_CLINIC_OR_DEPARTMENT_OTHER): Admitting: Physical Therapy

## 2024-05-03 ENCOUNTER — Encounter (HOSPITAL_BASED_OUTPATIENT_CLINIC_OR_DEPARTMENT_OTHER): Admitting: Physical Therapy

## 2024-05-11 ENCOUNTER — Ambulatory Visit (HOSPITAL_BASED_OUTPATIENT_CLINIC_OR_DEPARTMENT_OTHER): Admitting: Physical Therapy
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
## 2024-05-18 ENCOUNTER — Ambulatory Visit (HOSPITAL_BASED_OUTPATIENT_CLINIC_OR_DEPARTMENT_OTHER): Attending: Family Medicine | Admitting: Physical Therapy

## 2024-05-19 ENCOUNTER — Other Ambulatory Visit (HOSPITAL_BASED_OUTPATIENT_CLINIC_OR_DEPARTMENT_OTHER): Payer: Self-pay

## 2024-05-19 MED ORDER — MISOPROSTOL 200 MCG PO TABS
400.0000 ug | ORAL_TABLET | Freq: Once | ORAL | 0 refills | Status: AC
  Filled 2024-05-19: qty 2, 1d supply, fill #0

## 2024-05-24 ENCOUNTER — Ambulatory Visit (HOSPITAL_BASED_OUTPATIENT_CLINIC_OR_DEPARTMENT_OTHER): Admitting: Physical Therapy

## 2024-05-31 ENCOUNTER — Encounter (HOSPITAL_BASED_OUTPATIENT_CLINIC_OR_DEPARTMENT_OTHER): Admitting: Physical Therapy

## 2024-05-31 ENCOUNTER — Ambulatory Visit: Admitting: Physical Therapy

## 2024-06-02 ENCOUNTER — Ambulatory Visit

## 2024-06-02 DIAGNOSIS — F331 Major depressive disorder, recurrent, moderate: Secondary | ICD-10-CM | POA: Diagnosis not present

## 2024-06-02 DIAGNOSIS — D509 Iron deficiency anemia, unspecified: Secondary | ICD-10-CM

## 2024-06-02 DIAGNOSIS — G43E19 Chronic migraine with aura, intractable, without status migrainosus: Secondary | ICD-10-CM

## 2024-06-02 DIAGNOSIS — F411 Generalized anxiety disorder: Secondary | ICD-10-CM | POA: Diagnosis not present

## 2024-06-02 DIAGNOSIS — I10 Essential (primary) hypertension: Secondary | ICD-10-CM

## 2024-06-02 DIAGNOSIS — N921 Excessive and frequent menstruation with irregular cycle: Secondary | ICD-10-CM

## 2024-06-02 DIAGNOSIS — R7303 Prediabetes: Secondary | ICD-10-CM

## 2024-06-02 DIAGNOSIS — D259 Leiomyoma of uterus, unspecified: Secondary | ICD-10-CM | POA: Diagnosis not present

## 2024-06-02 DIAGNOSIS — E559 Vitamin D deficiency, unspecified: Secondary | ICD-10-CM

## 2024-06-02 DIAGNOSIS — Z483 Aftercare following surgery for neoplasm: Secondary | ICD-10-CM | POA: Diagnosis not present

## 2024-07-21 ENCOUNTER — Ambulatory Visit: Payer: Self-pay

## 2024-07-21 NOTE — Telephone Encounter (Signed)
 FYI Only or Action Required?: FYI only for provider: ED advised.  Patient was last seen in primary care on 03/03/2024 by Knute Thersia Bitters, FNP.  Called Nurse Triage reporting Dizziness, Sore Throat, and Blurred Vision.  Symptoms began today.  Interventions attempted: OTC medications: Motrin , Coricidin and Rest, hydration, or home remedies.  Symptoms are: gradually worsening.  Triage Disposition: Go to ED Now (or PCP Triage)  Patient/caregiver understands and will follow disposition?: Yes   Message from [place] R sent at 07/21/2024  2:50 PM EST  sore throat, sinus congestion,  headache, light headed,  dizziness/ light headed   Reason for Disposition  Patient sounds very sick or weak to the triager    Concern for infection.  Additional Information  Negative: Loss of vision or double vision  (Exception: Similar to previous migraines.)    Migraine present per patient.  Answer Assessment - Initial Assessment Questions DESCRIPTION: Describe your dizziness.     Feels like going to pass out.  Not a spinning sensation.   2. LIGHTHEADED: Do you feel lightheaded? 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  History of anemia. Iron infusions in past.   Wonders if related but also having cold symptoms.   3. VERTIGO: Do you feel like either you or the room is spinning or tilting? (i.e., vertigo)     No  4. SEVERITY: How bad is it?  Do you feel like you are going to faint? Can you stand and walk?     Mild dizziness continues at rest during call and having blurred vision.    6. AGGRAVATING FACTORS: Does anything make it worse? (e.g., standing, change in head position)     Standing;  ate an orange thinking this may improve blood sugar if related, acidity burned throat  8. CAUSE: What do you think is causing the dizziness? (e.g., decreased fluids or food, diarrhea, emotional distress,  heat exposure, new medicine, sudden standing, vomiting; unknown)     History of anemia but also having cold symptoms, sore throat.   9. RECURRENT SYMPTOM: Have you had dizziness before? If Yes, ask: When was the last time? What happened that time?     Yes, but did not have cold symptoms previously  10. OTHER SYMPTOMS: Do you have any other symptoms? (e.g., fever, chest pain, vomiting, diarrhea, bleeding)       Sore throat, congestion, headache- does feel like a migraine; ate orange earlier burned throat. No fever or chills but has taken Motrin  and Coricidin - not helping.    Vision changed- blurry. Feels like headache/ migraine.  No one with patient right now but can call for ride to ED.  Sitting down.  Protocols used: Dizziness - Lightheadedness-A-AH

## 2024-08-09 ENCOUNTER — Ambulatory Visit

## 2024-08-31 ENCOUNTER — Ambulatory Visit (HOSPITAL_BASED_OUTPATIENT_CLINIC_OR_DEPARTMENT_OTHER): Admitting: Family Medicine

## 2024-09-07 ENCOUNTER — Ambulatory Visit
